# Patient Record
Sex: Female | Born: 1990 | Hispanic: Yes | Marital: Single
Health system: Southern US, Community
[De-identification: ages and names within clinical notes are randomized; demographics above are authoritative.]

---

## 2015-03-13 ENCOUNTER — Inpatient Hospital Stay
Admit: 2015-03-13 | Discharge: 2015-03-14 | Disposition: A | Discharge status: Home / Self Care | Payer: Medicaid HMO | Attending: Emergency Medicine

## 2015-03-13 DIAGNOSIS — B349 Viral infection, unspecified: Secondary | ICD-10-CM

## 2015-03-13 NOTE — ED Notes (Signed)
Discharge paperwork explained and provided to patient.  Patient verbalized understanding and will follow up with PCP.   Patient alert and orientated x3, vss, nad noted.  Patient ambulates out of ED with steady gait.

## 2015-03-13 NOTE — ED Triage Notes (Signed)
Pt chills, nausea and vomiting. Pt also c/o chest tightness, hx of asthma.

## 2015-03-13 NOTE — ED Provider Notes (Addendum)
The history is provided by the patient.     8:07 PM: Sylvia Baker is a 24 y.o. female with h/o asthma who presents to the ED c/o fevers, chills N/V, and weakness for the past couple of days. Pt also c/o loss of appetite, sore throat and rhinorrhea. She feels general malaise. Pt states she babysat her nephew who was sick and developed the same sx. Pt did not take her temperature but felt warm. No other complaints at this time.        Past Medical History:   Diagnosis Date   ??? Gastrointestinal disorder    ??? Psychiatric disorder        History reviewed. No pertinent past surgical history.      History reviewed. No pertinent family history.    Social History     Social History   ??? Marital status: SINGLE     Spouse name: N/A   ??? Number of children: N/A   ??? Years of education: N/A     Occupational History   ??? Not on file.     Social History Main Topics   ??? Smoking status: Never Smoker   ??? Smokeless tobacco: Not on file   ??? Alcohol use No   ??? Drug use: Not on file   ??? Sexual activity: Not on file     Other Topics Concern   ??? Not on file     Social History Narrative   ??? No narrative on file         ALLERGIES: Review of patient's allergies indicates no known allergies.    Review of Systems   Constitutional: Positive for appetite change (decreased), chills, fatigue and fever.   HENT: Positive for rhinorrhea and sore throat.    Eyes: Negative for photophobia and visual disturbance.   Respiratory: Negative for cough and shortness of breath.    Cardiovascular: Negative for chest pain and leg swelling.   Gastrointestinal: Positive for nausea and vomiting. Negative for abdominal pain and diarrhea.   Genitourinary: Negative for dysuria and hematuria.   Musculoskeletal: Negative for back pain and myalgias.   Skin: Negative for color change and pallor.   Neurological: Negative for seizures and syncope.       Vitals:    03/13/15 1852   BP: 112/74   Pulse: (!) 104   Resp: 18   Temp: 98 ??F (36.7 ??C)   SpO2: 100%    Weight: 54.4 kg (120 lb)   Height:  (1.499 m)            Physical Exam   Constitutional: She is oriented to person, place, and time. She appears well-developed and well-nourished. No distress.   Fatigued.    HENT:   Head: Normocephalic and atraumatic.   Nose: Rhinorrhea present.   Eyes: EOM are normal. Pupils are equal, round, and reactive to light.   Neck: Neck supple. No JVD present.   Cardiovascular: Normal rate, regular rhythm and normal heart sounds.    Pulmonary/Chest: Effort normal and breath sounds normal. No respiratory distress. She has no wheezes. She has no rales.   Abdominal: Soft. Bowel sounds are normal. She exhibits no distension. There is no tenderness. There is no rebound and no guarding.   Musculoskeletal: Normal range of motion. She exhibits no edema or tenderness.   Neurological: She is alert and oriented to person, place, and time.   Skin: Skin is warm and dry.   Psychiatric: She has a normal mood and affect.  Nursing note and vitals reviewed.       MDM  Number of Diagnoses or Management Options     Amount and/or Complexity of Data Reviewed  Clinical lab tests: ordered and reviewed  Decide to obtain previous medical records or to obtain history from someone other than the patient: yes  Review and summarize past medical records: yes  Independent visualization of images, tracings, or specimens: yes      ED Course       Procedures    I, Lowry Bowlob Kreel, PA, reviewed the patient's past history, allergies and home medications as documented in the nursing chart.    Labs:  Recent Results (from the past 24 hour(s))   STREP AG SCREEN, GROUP A    Collection Time: 03/13/15  8:00 PM   Result Value Ref Range    Group A Strep Ag ID NEGATIVE       Method IMMUNOCHROMATOGRAPHIC MEMBRANE ASSAY     INFLUENZA A & B AG (RAPID TEST)    Collection Time: 03/13/15  8:00 PM   Result Value Ref Range    Influenza A Ag NEGATIVE       Influenza B Ag NEGATIVE       Method IMMUNOCHROMATOGRAPHIC MEMBRANE ASSAY       Comment Result: Negative for Influenza A and B     CBC WITH AUTOMATED DIFF    Collection Time: 03/13/15  8:20 PM   Result Value Ref Range    WBC 11.0 (H) 4.8 - 10.6 K/uL    RBC 3.81 (L) 4.20 - 5.40 M/uL    HGB 11.9 (L) 12.0 - 16.0 g/dL    HCT 96.034.8 (L) 45.436.0 - 47.0 %    MCV 91.3 81.0 - 94.0 FL    MCH 31.2 27.0 - 35.0 PG    MCHC 34.2 30.7 - 37.3 g/dL    RDW 09.812.4 11.911.5 - 14.714.0 %    PLATELET 348 130 - 400 K/uL    MPV 11.9 (H) 9.2 - 11.8 FL    NEUTROPHILS 78 (H) 48.0 - 72.0 %    LYMPHOCYTES 16 (L) 18.0 - 40.0 %    MONOCYTES 5 2.0 - 12.0 %    EOSINOPHILS 1 0.0 - 7.0 %    BASOPHILS 0 0.0 - 3.0 %    ABS. NEUTROPHILS 8.7 (H) 1.5 - 6.6 K/UL    ABS. LYMPHOCYTES 1.7 1.5 - 3.5 K/UL    ABS. MONOCYTES 0.5 0.0 - 1.0 K/UL    ABS. EOSINOPHILS 0.1 0.0 - 0.7 K/UL    ABS. BASOPHILS 0.0 0.0 - 0.1 K/UL    DF AUTOMATED      IMMATURE GRANULOCYTES 0.2 0.0 - 2.0 %   METABOLIC PANEL, BASIC    Collection Time: 03/13/15  8:20 PM   Result Value Ref Range    Sodium 139 136 - 145 mmol/L    Potassium 3.8 3.5 - 5.1 mmol/L    Chloride 107 98 - 107 mmol/L    CO2 21 21 - 32 mmol/L    Anion gap 14 10 - 20 mmol/L    Glucose 81 74 - 106 mg/dL    BUN 11 7 - 18 mg/dL    Creatinine 8.290.80 5.620.40 - 1.16 mg/dL    GFR est AA >13>60 >08>60 MV/HQI/6.96E9ml/min/1.73m2    GFR est non-AA >60 >60 ml/min/1.4873m2    Calcium 8.8 8.5 - 10.1 mg/dL   MAGNESIUM    Collection Time: 03/13/15  8:20 PM   Result Value Ref Range  Magnesium 2.1 1.6 - 2.6 mg/dL       Radiology:           <EMERGENCY DEPARTMENT CASE SUMMARY>    Impression/Differential Diagnosis: fever cough    ED Course:     Pt is 24 yr old female who presents to the ED c/o fevers, N/V and chills.  Labs ordered. Zofran and duo-neb given.   2130  Pt feeling better  Labs are unremarkable      Final Impression/Diagnosis:  Viral syndrome    Patient condition at time of disposition: stable      I have reviewed the following home medications:    Prior to Admission medications    Medication Sig Start Date End Date Taking? Authorizing Provider    omeprazole (PRILOSEC) 20 mg capsule Take 20 mg by mouth daily.   Yes Phys Other, MD   buPROPion XL (WELLBUTRIN XL) 300 mg XL tablet Take 300 mg by mouth every morning.   Yes Phys Other, MD   fluticasone-salmeterol (ADVAIR DISKUS) 100-50 mcg/dose diskus inhaler Take 1 Puff by inhalation two (2) times a day.   Yes Phys Other, MD   norgestimate-ethinyl estradiol (SPRINTEC, 28,) 0.25-35 mg-mcg tab Take 1 Tab by mouth daily.   Yes Phys Other, MD       Lowry Bowl, PA     I, Artis Delay, am serving as a scribe to document services personally performed by Lowry Bowl, PA, based on my observation and the provider's statements to me.    I, Lowry Bowl, PA, attest that the person(s) noted above, acting as my scribe(s) noted above, has observed my performance of the services and has documented them in accordance with my direction. I have personally reviewed the above information and have ordered and reviewed the diagnostic studies, unless otherwise noted.  I was personally available for consultation in the emergency department.  I have reviewed the chart and agree with the documentation recorded by the [facility], including the assessment, treatment plan, and disposition.  Earnie Larsson, MD

## 2015-03-14 LAB — METABOLIC PANEL, BASIC
Anion gap: 14 mmol/L (ref 10–20)
BUN: 11 mg/dL (ref 7–18)
CO2: 21 mmol/L (ref 21–32)
Calcium: 8.8 mg/dL (ref 8.5–10.1)
Chloride: 107 mmol/L (ref 98–107)
Creatinine: 0.8 mg/dL (ref 0.40–1.16)
GFR est AA: >60 mL/min/{1.73_m2} (ref >60)
GFR est non-AA: >60 mL/min/{1.73_m2} (ref >60)
Glucose: 81 mg/dL (ref 74–106)
Potassium: 3.8 mmol/L (ref 3.5–5.1)
Sodium: 139 mmol/L (ref 136–145)

## 2015-03-14 LAB — INFLUENZA A & B AG (RAPID TEST)
Comment: NEGATIVE
Influenza A Ag: NEGATIVE
Influenza B Ag: NEGATIVE

## 2015-03-14 LAB — CBC WITH AUTOMATED DIFF
ABS. BASOPHILS: 0 10*3/uL (ref 0.0–0.1)
ABS. EOSINOPHILS: 0.1 10*3/uL (ref 0.0–0.7)
ABS. LYMPHOCYTES: 1.7 10*3/uL (ref 1.5–3.5)
ABS. MONOCYTES: 0.5 10*3/uL (ref 0.0–1.0)
ABS. NEUTROPHILS: 8.7 10*3/uL — ABNORMAL HIGH (ref 1.5–6.6)
BASOPHILS: 0 % (ref 0.0–3.0)
EOSINOPHILS: 1 % (ref 0.0–7.0)
HCT: 34.8 % — ABNORMAL LOW (ref 36.0–47.0)
HGB: 11.9 g/dL — ABNORMAL LOW (ref 12.0–16.0)
IMMATURE GRANULOCYTES: 0.2 % (ref 0.0–2.0)
LYMPHOCYTES: 16 % — ABNORMAL LOW (ref 18.0–40.0)
MCH: 31.2 PG (ref 27.0–35.0)
MCHC: 34.2 g/dL (ref 30.7–37.3)
MCV: 91.3 FL (ref 81.0–94.0)
MONOCYTES: 5 % (ref 2.0–12.0)
MPV: 11.9 FL — ABNORMAL HIGH (ref 9.2–11.8)
NEUTROPHILS: 78 % — ABNORMAL HIGH (ref 48.0–72.0)
PLATELET: 348 10*3/uL (ref 130–400)
RBC: 3.81 M/uL — ABNORMAL LOW (ref 4.20–5.40)
RDW: 12.4 % (ref 11.5–14.0)
WBC: 11 10*3/uL — ABNORMAL HIGH (ref 4.8–10.6)

## 2015-03-14 LAB — MAGNESIUM: Magnesium: 2.1 mg/dL (ref 1.6–2.6)

## 2015-03-14 LAB — STREP AG SCREEN, GROUP A: Group A Strep Ag ID: NEGATIVE

## 2015-03-14 MED ORDER — SODIUM CHLORIDE 0.9% BOLUS IV
0.9 % | Freq: Once | INTRAVENOUS | Status: AC
Start: 2015-03-14 — End: 2015-03-13
  Administered 2015-03-14: 01:00:00 via INTRAVENOUS

## 2015-03-14 MED ORDER — IPRATROPIUM-ALBUTEROL 2.5 MG-0.5 MG/3 ML NEB SOLUTION
2.5 mg-0.5 mg/3 ml | RESPIRATORY_TRACT | Status: AC
Start: 2015-03-14 — End: 2015-03-13
  Administered 2015-03-14: 01:00:00 via RESPIRATORY_TRACT

## 2015-03-14 MED ORDER — ONDANSETRON (PF) 4 MG/2 ML INJECTION
4 mg/2 mL | INTRAMUSCULAR | Status: AC
Start: 2015-03-14 — End: 2015-03-13
  Administered 2015-03-14: 01:00:00 via INTRAVENOUS

## 2015-03-14 MED FILL — ONDANSETRON (PF) 4 MG/2 ML INJECTION: 4 mg/2 mL | INTRAMUSCULAR | Qty: 2

## 2015-03-14 MED FILL — SODIUM CHLORIDE 0.9 % IV: INTRAVENOUS | Qty: 2000

## 2015-03-14 MED FILL — IPRATROPIUM-ALBUTEROL 2.5 MG-0.5 MG/3 ML NEB SOLUTION: 2.5 mg-0.5 mg/3 ml | RESPIRATORY_TRACT | Qty: 3

## 2015-03-16 LAB — THROAT CULTURE

## 2020-06-21 ENCOUNTER — Observation Stay (HOSPITAL_COMMUNITY)
Admission: EM | Admit: 2020-06-21 | Discharge: 2020-06-23 | Disposition: A | Discharge status: Home / Self Care | Payer: Medicaid - Out of State | Attending: General Surgery | Admitting: General Surgery

## 2020-06-21 ENCOUNTER — Emergency Department (HOSPITAL_COMMUNITY): Payer: Medicaid - Out of State

## 2020-06-21 ENCOUNTER — Other Ambulatory Visit: Payer: Self-pay

## 2020-06-21 ENCOUNTER — Encounter (HOSPITAL_COMMUNITY): Payer: Self-pay | Admitting: Physician Assistant

## 2020-06-21 DIAGNOSIS — K8012 Calculus of gallbladder with acute and chronic cholecystitis without obstruction: Principal | ICD-10-CM | POA: Insufficient documentation

## 2020-06-21 DIAGNOSIS — J45909 Unspecified asthma, uncomplicated: Secondary | ICD-10-CM | POA: Diagnosis not present

## 2020-06-21 DIAGNOSIS — R10811 Right upper quadrant abdominal tenderness: Secondary | ICD-10-CM

## 2020-06-21 DIAGNOSIS — Z20822 Contact with and (suspected) exposure to covid-19: Secondary | ICD-10-CM | POA: Insufficient documentation

## 2020-06-21 DIAGNOSIS — K810 Acute cholecystitis: Secondary | ICD-10-CM | POA: Diagnosis present

## 2020-06-21 DIAGNOSIS — R1011 Right upper quadrant pain: Secondary | ICD-10-CM | POA: Diagnosis present

## 2020-06-21 DIAGNOSIS — K819 Cholecystitis, unspecified: Secondary | ICD-10-CM

## 2020-06-21 LAB — URINALYSIS, ROUTINE W REFLEX MICROSCOPIC
Bilirubin Urine: NEGATIVE
Glucose, UA: NEGATIVE mg/dL
Ketones, ur: 80 mg/dL — AB
Leukocytes,Ua: NEGATIVE
Nitrite: NEGATIVE
Protein, ur: NEGATIVE mg/dL
Specific Gravity, Urine: 1.024 (ref 1.005–1.030)
pH: 5 (ref 5.0–8.0)

## 2020-06-21 LAB — COMPREHENSIVE METABOLIC PANEL
ALT: 16 U/L (ref 0–44)
AST: 21 U/L (ref 15–41)
Albumin: 3.3 g/dL — ABNORMAL LOW (ref 3.5–5.0)
Alkaline Phosphatase: 65 U/L (ref 38–126)
Anion gap: 7 (ref 5–15)
BUN: 8 mg/dL (ref 6–20)
CO2: 26 mmol/L (ref 22–32)
Calcium: 9 mg/dL (ref 8.9–10.3)
Chloride: 103 mmol/L (ref 98–111)
Creatinine, Ser: 0.86 mg/dL (ref 0.44–1.00)
GFR, Estimated: >60 mL/min (ref >60)
Glucose, Bld: 95 mg/dL (ref 70–99)
Potassium: 4.2 mmol/L (ref 3.5–5.1)
Sodium: 136 mmol/L (ref 135–145)
Total Bilirubin: 0.2 mg/dL — ABNORMAL LOW (ref 0.3–1.2)
Total Protein: 6.9 g/dL (ref 6.5–8.1)

## 2020-06-21 LAB — CBC WITH DIFFERENTIAL/PLATELET
Abs Immature Granulocytes: 0.03 10*3/uL (ref 0.00–0.07)
Basophils Absolute: 0.1 10*3/uL (ref 0.0–0.1)
Basophils Relative: 1 %
Eosinophils Absolute: 0.1 10*3/uL (ref 0.0–0.5)
Eosinophils Relative: 1 %
HCT: 38.5 % (ref 36.0–46.0)
Hemoglobin: 12.5 g/dL (ref 12.0–15.0)
Immature Granulocytes: 0 %
Lymphocytes Relative: 18 %
Lymphs Abs: 2.1 10*3/uL (ref 0.7–4.0)
MCH: 30.8 pg (ref 26.0–34.0)
MCHC: 32.5 g/dL (ref 30.0–36.0)
MCV: 94.8 fL (ref 80.0–100.0)
Monocytes Absolute: 0.6 10*3/uL (ref 0.1–1.0)
Monocytes Relative: 5 %
Neutro Abs: 8.5 10*3/uL — ABNORMAL HIGH (ref 1.7–7.7)
Neutrophils Relative %: 75 %
Platelets: 373 10*3/uL (ref 150–400)
RBC: 4.06 MIL/uL (ref 3.87–5.11)
RDW: 12.6 % (ref 11.5–15.5)
WBC: 11.4 10*3/uL — ABNORMAL HIGH (ref 4.0–10.5)
nRBC: 0 % (ref 0.0–0.2)

## 2020-06-21 LAB — LIPASE, BLOOD: Lipase: 25 U/L (ref 11–51)

## 2020-06-21 LAB — I-STAT BETA HCG BLOOD, ED (MC, WL, AP ONLY): I-stat hCG, quantitative: <5 m[IU]/mL (ref <5)

## 2020-06-21 MED ORDER — SODIUM CHLORIDE 0.9 % IV BOLUS
1000.0000 mL | Freq: Once | INTRAVENOUS | Status: AC
  Administered 2020-06-21: 1000 mL via INTRAVENOUS

## 2020-06-21 MED ORDER — MORPHINE SULFATE (PF) 4 MG/ML IV SOLN: 4.0000 mg | Freq: Once | INTRAVENOUS | Status: DC

## 2020-06-21 MED ORDER — ONDANSETRON HCL 4 MG/2ML IJ SOLN
4.0000 mg | Freq: Once | INTRAMUSCULAR | Status: AC
  Administered 2020-06-21: 4 mg via INTRAVENOUS
  Filled 2020-06-21: qty 2

## 2020-06-21 MED ORDER — IOHEXOL 300 MG/ML  SOLN
100.0000 mL | Freq: Once | INTRAMUSCULAR | Status: AC | PRN
  Administered 2020-06-21: 100 mL via INTRAVENOUS

## 2020-06-21 MED ORDER — MORPHINE SULFATE (PF) 4 MG/ML IV SOLN
4.0000 mg | Freq: Once | INTRAVENOUS | Status: AC
  Administered 2020-06-21: 4 mg via INTRAVENOUS
  Filled 2020-06-21: qty 1

## 2020-06-21 NOTE — Discharge Instructions (Addendum)
[facility] LAPAROSCOPIC SURGERY: POST OP INSTRUCTIONS Always review your discharge instruction sheet given to you by the facility where your surgery was performed. IF YOU HAVE DISABILITY OR FAMILY LEAVE FORMS, YOU MUST BRING THEM TO THE OFFICE FOR PROCESSING.   DO NOT GIVE THEM TO YOUR DOCTOR.  PAIN CONTROL  1. First take acetaminophen (Tylenol) AND/or ibuprofen (Advil) to control your pain after surgery.  Follow directions on package.  Taking acetaminophen (Tylenol) and/or ibuprofen (Advil) regularly after surgery will help to control your pain and lower the amount of prescription pain medication you may need.  You should not take more than 3,000 mg (3 grams) of acetaminophen (Tylenol) in 24 hours.  You should not take ibuprofen (Advil), aleve, motrin, naprosyn or other NSAIDS if you have a history of stomach ulcers or chronic kidney disease.  2. A prescription for pain medication may be given to you upon discharge.  Take your pain medication as prescribed, if you still have uncontrolled pain after taking acetaminophen (Tylenol) or ibuprofen (Advil). 3. Use ice packs to help control pain. 4. If you need a refill on your pain medication, please contact your pharmacy.  They will contact our office to request authorization. Prescriptions will not be filled after 5pm or on week-ends.  HOME MEDICATIONS 5. Take your usually prescribed medications unless otherwise directed.  DIET 6. You should follow a light diet the first few days after arrival home.  Be sure to include lots of fluids daily. Avoid fatty, fried foods.   CONSTIPATION 7. It is common to experience some constipation after surgery and if you are taking pain medication.  Increasing fluid intake and taking a stool softener (such as Colace) will usually help or prevent this problem from occurring.  A mild laxative (Milk of Magnesia or Miralax) should be taken according to package instructions if there are no bowel  movements after 48 hours.  WOUND/INCISION CARE 8. Most patients will experience some swelling and bruising in the area of the incisions.  Ice packs will help.  Swelling and bruising can take several days to resolve.  9. Unless discharge instructions indicate otherwise, follow guidelines below  a. STERI-STRIPS - you may remove your outer bandages 48 hours after surgery, and you may shower at that time.  You have steri-strips (small skin tapes) in place directly over the incision.  These strips should be left on the skin for 7-10 days.   b. DERMABOND/SKIN GLUE - you may shower in 24 hours.  The glue will flake off over the next 2-3 weeks. 10. Any sutures or staples will be removed at the office during your follow-up visit.  ACTIVITIES 11. You may resume regular (light) daily activities beginning the next day--such as daily self-care, walking, climbing stairs--gradually increasing activities as tolerated.  You may have sexual intercourse when it is comfortable.  Refrain from any heavy lifting or straining until approved by your doctor. a. You may drive when you are no longer taking prescription pain medication, you can comfortably wear a seatbelt, and you can safely maneuver your car and apply brakes.  FOLLOW-UP 12. You should see your doctor in the office for a follow-up appointment approximately 2-3 weeks after your surgery.  You should have been given your post-op/follow-up appointment when your surgery was scheduled.  If you did not receive a post-op/follow-up appointment, make sure that you call for this appointment within a day or two after you arrive home to insure a convenient appointment time.     WHEN TO CALL YOUR DOCTOR: 1. Fever over 101.0 2. Inability to urinate 3. Continued bleeding from incision. 4. Increased pain, redness, or drainage from the incision. 5. Increasing abdominal pain  The clinic staff is available to answer your questions during regular business hours.  Please don't  hesitate to call and ask to speak to one of the nurses for clinical concerns.  If you have a medical emergency, go to the nearest emergency room or call 911.  A surgeon from [facility] is always on call at the hospital. [address], [address], [place], [place]  [address] ? [address] [address], [place], [place]   [address] [phone] ? [phone] ? FAX [phone] Web site: [email or web address]  .........   Managing Your Pain After Surgery Without Opioids    Thank you for participating in our program to help patients manage their pain after surgery without opioids. This is part of our effort to provide you with the best care possible, without exposing you or your family to the risk that opioids pose.  What pain can I expect after surgery? You can expect to have some pain after surgery. This is normal. The pain is typically worse the day after surgery, and quickly begins to get better. Many studies have found that many patients are able to manage their pain after surgery with Over-the-Counter (OTC) medications such as Tylenol and Motrin. If you have a condition that does not allow you to take Tylenol or Motrin, notify your surgical team.  How will I manage my pain? The best strategy for controlling your pain after surgery is around the clock pain control with Tylenol (acetaminophen) and Motrin (ibuprofen or Advil). Alternating these medications with each other allows you to maximize your pain control. In addition to Tylenol and Motrin, you can use heating pads or ice packs on your incisions to help reduce your pain.  How will I alternate your regular strength over-the-counter pain medication? You will take a dose of pain medication every three hours. ; Start by taking 650 mg of Tylenol (2 pills of 325 mg) ; 3 hours later take 600 mg of Motrin (3 pills of 200 mg) ; 3 hours after taking the Motrin take 650 mg of Tylenol ; 3 hours after that take 600 mg of  Motrin.   - 1 -  See example - if your first dose of Tylenol is at 12:00 PM   12:00 PM Tylenol 650 mg (2 pills of 325 mg)  3:00 PM Motrin 600 mg (3 pills of 200 mg)  6:00 PM Tylenol 650 mg (2 pills of 325 mg)  9:00 PM Motrin 600 mg (3 pills of 200 mg)  Continue alternating every 3 hours   We recommend that you follow this schedule around-the-clock for at least 3 days after surgery, or until you feel that it is no longer needed. Use the table on the last page of this handout to keep track of the medications you are taking. Important: Do not take more than 3000mg of Tylenol or 3200mg of Motrin in a 24-hour period. Do not take ibuprofen/Motrin if you have a history of bleeding stomach ulcers, severe kidney disease, &/or actively taking a blood thinner  What if I still have pain? If you have pain that is not controlled with the over-the-counter pain medications (Tylenol and Motrin or Advil) you might have what we call "breakthrough" pain. You will receive a prescription for a small amount of an opioid pain medication such as   Oxycodone, Tramadol, or Tylenol with Codeine. Use these opioid pills in the first 24 hours after surgery if you have breakthrough pain. Do not take more than 1 pill every 4-6 hours.  If you still have uncontrolled pain after using all opioid pills, don't hesitate to call our staff using the number provided. We will help make sure you are managing your pain in the best way possible, and if necessary, we can provide a prescription for additional pain medication.   Day 1    Time  Name of Medication Number of pills taken  Amount of Acetaminophen  Pain Level   Comments  AM PM       AM PM       AM PM       AM PM       AM PM       AM PM       AM PM       AM PM       Total Daily amount of Acetaminophen Do not take more than  3,000 mg per day      Day 2    Time  Name of Medication Number of pills taken  Amount of Acetaminophen  Pain Level   Comments  AM  PM       AM PM       AM PM       AM PM       AM PM       AM PM       AM PM       AM PM       Total Daily amount of Acetaminophen Do not take more than  3,000 mg per day      Day 3    Time  Name of Medication Number of pills taken  Amount of Acetaminophen  Pain Level   Comments  AM PM       AM PM       AM PM       AM PM          AM PM       AM PM       AM PM       AM PM       Total Daily amount of Acetaminophen Do not take more than  3,000 mg per day      Day 4    Time  Name of Medication Number of pills taken  Amount of Acetaminophen  Pain Level   Comments  AM PM       AM PM       AM PM       AM PM       AM PM       AM PM       AM PM       AM PM       Total Daily amount of Acetaminophen Do not take more than  3,000 mg per day      Day 5    Time  Name of Medication Number of pills taken  Amount of Acetaminophen  Pain Level   Comments  AM PM       AM PM       AM PM       AM PM       AM PM       AM PM       AM PM         AM PM       Total Daily amount of Acetaminophen Do not take more than  3,000 mg per day       Day 6    Time  Name of Medication Number of pills taken  Amount of Acetaminophen  Pain Level  Comments  AM PM       AM PM       AM PM       AM PM       AM PM       AM PM       AM PM       AM PM       Total Daily amount of Acetaminophen Do not take more than  3,000 mg per day      Day 7    Time  Name of Medication Number of pills taken  Amount of Acetaminophen  Pain Level   Comments  AM PM       AM PM       AM PM       AM PM       AM PM       AM PM       AM PM       AM PM       Total Daily amount of Acetaminophen Do not take more than  3,000 mg per day        For additional information about how and where to safely dispose of unused opioid medications - https://www.[employer]powerful[employer].org  Disclaimer: This document contains information and/or instructional materials adapted from [facility]  for the typical patient with your condition. It does not replace medical advice from your health care provider because your experience may differ from that of the typical patient. Talk to your health care provider if you have any questions about this document, your condition or your treatment plan. Adapted from [facility]  

## 2020-06-21 NOTE — ED Provider Notes (Addendum)
[facility] EMERGENCY DEPARTMENT Provider Note   CSN: [id number] Arrival date & time: 06/21/20  1830     History Chief Complaint  Patient presents with  . Abdominal Pain    Rebecca Lester is a 30 y.o. female.  HPI   Pt is a 30 y/o female who presents to the eD today for eval of right sided abd pain that started 4 days ago. Pain has been constant and seems to wax and wane. Pain feels like a twisting/sqeezing/sharp pain. Rates pain 7-8/10. Reports associated nausea, vomiting and diarrhea. Denies dysuria, frequency, hematuria. Denies abnormal vaginal discharge or bleeding. Denies fevers. Has tried taken miralax, tylenol, ginger tea, and pepto bismal without relief.   History reviewed. No pertinent past medical history.  There are no problems to display for this patient.   History reviewed. No pertinent surgical history.   OB History   No obstetric history on file.     History reviewed. No pertinent family history.     Home Medications Prior to Admission medications   Not on File    Allergies    Patient has no known allergies.  Review of Systems   Review of Systems  Constitutional: Negative for chills and fever.  HENT: Negative for ear pain and sore throat.   Eyes: Negative for pain and visual disturbance.  Respiratory: Negative for cough and shortness of breath.   Cardiovascular: Negative for chest pain.  Gastrointestinal: Positive for abdominal pain, diarrhea, nausea and vomiting.  Genitourinary: Negative for dysuria, flank pain, hematuria and urgency.  Musculoskeletal: Negative for back pain.  Skin: Negative for rash.  Neurological: Negative for headaches.  All other systems reviewed and are negative.   Physical Exam Updated Vital Signs BP (!) 136/94   Pulse 60   Temp 98.2 F (36.8 C) (Oral)   Resp 18   Ht 4' 11.5" (1.511 m)   Wt 65.8 kg   SpO2 100%   BMI 28.80 kg/m   Physical Exam Vitals and nursing note reviewed.   Constitutional:      General: She is not in acute distress.    Appearance: She is well-developed.  HENT:     Head: Normocephalic and atraumatic.  Eyes:     Conjunctiva/sclera: Conjunctivae normal.  Cardiovascular:     Rate and Rhythm: Normal rate and regular rhythm.     Heart sounds: No murmur heard.   Pulmonary:     Effort: Pulmonary effort is normal. No respiratory distress.     Breath sounds: Normal breath sounds.  Abdominal:     General: Bowel sounds are normal.     Palpations: Abdomen is soft.     Tenderness: There is abdominal tenderness in the right upper quadrant. There is guarding. There is no rebound.  Musculoskeletal:     Cervical back: Neck supple.  Skin:    General: Skin is warm and dry.  Neurological:     Mental Status: She is alert.     ED Results / Procedures / Treatments   Labs (all labs ordered are listed, but only abnormal results are displayed) Labs Reviewed  CBC WITH DIFFERENTIAL/PLATELET - Abnormal; Notable for the following components:      Result Value   WBC 11.4 (*)    Neutro Abs 8.5 (*)    All other components within normal limits  COMPREHENSIVE METABOLIC PANEL - Abnormal; Notable for the following components:   Albumin 3.3 (*)    Total Bilirubin 0.2 (*)    All other components  within normal limits  URINALYSIS, ROUTINE W REFLEX MICROSCOPIC - Abnormal; Notable for the following components:   Hgb urine dipstick SMALL (*)    Ketones, ur 80 (*)    Bacteria, UA RARE (*)    All other components within normal limits  RESP PANEL BY RT-PCR (FLU A&B, COVID) ARPGX2  LIPASE, BLOOD  I-STAT BETA HCG BLOOD, ED ([facility], [facility], [facility] ONLY)    EKG None  Radiology CT ABDOMEN PELVIS W CONTRAST  Result Date: 06/21/2020 CLINICAL DATA:  Right lower quadrant abdominal pain. EXAM: CT ABDOMEN AND PELVIS WITH CONTRAST TECHNIQUE: Multidetector CT imaging of the abdomen and pelvis was performed using the standard protocol following bolus administration of intravenous  contrast. CONTRAST:  OMNIPAQUE IOHEXOL 300 MG/ML  SOLN COMPARISON:  None. FINDINGS: Lower chest: No acute abnormality. Hepatobiliary: No focal liver abnormality. Nonspecific hydropic gallbladder measuring up to 4 cm on axial imaging. No gallstones, gallbladder wall thickening, or pericholecystic fluid. No biliary dilatation. Pancreas: No focal lesion. Normal pancreatic contour. No surrounding inflammatory changes. No main pancreatic ductal dilatation. Spleen: Normal in size without focal abnormality. Adrenals/Urinary Tract: No adrenal nodule bilaterally. Bilateral kidneys enhance symmetrically. No hydronephrosis. No hydroureter. The urinary bladder is decompressed and grossly unremarkable. Stomach/Bowel: Stomach is within normal limits. No evidence of bowel wall thickening or dilatation. Appendix appears normal. Vascular/Lymphatic: No significant vascular findings are present. No enlarged abdominal or pelvic lymph nodes. Reproductive: Uterus and bilateral adnexa are unremarkable. Other: No intraperitoneal free fluid. No intraperitoneal free gas. No organized fluid collection. Musculoskeletal: No acute or significant osseous findings. IMPRESSION: 1. Nonspecific hydropic gallbladder with otherwise no acute intra-abdominal or intrapelvic abnormality. If clinically indicated, consider right upper quadrant ultrasound for a more sensitive evaluation of the gallbladder. 2. Normal appendix. Electronically Signed   By: Tish Frederickson M.D.   On: 06/21/2020 22:29   US Abdomen Limited RUQ (LIVER/GB)  Result Date: 06/21/2020 CLINICAL DATA:  Right upper quadrant abdominal pain EXAM: ULTRASOUND ABDOMEN LIMITED RIGHT UPPER QUADRANT COMPARISON:  CT from same day FINDINGS: Gallbladder: There is cholelithiasis with mild borderline gallbladder wall thickening. The gallbladder is distended. The sonographic Eulah Pont sign is positive. There is no pericholecystic free fluid. Common bile duct: Diameter: 3 mm Liver: No focal lesion  identified. Within normal limits in parenchymal echogenicity. Portal vein is patent on color Doppler imaging with normal direction of blood flow towards the liver. Other: None. IMPRESSION: Cholelithiasis with findings suspicious for acute cholecystitis. Surgical consultation is recommended. If imaging findings are discordant with laboratory and physical exam findings, a HIDA scan would be useful for further evaluation. Electronically Signed   By: Katherine Mantle M.D.   On: 06/21/2020 23:50    Procedures Procedures   Medications Ordered in ED Medications  morphine 4 MG/ML injection 4 mg (0 mg Intravenous Hold 06/21/20 2336)  cefTRIAXone (ROCEPHIN) 2 g in sodium chloride 0.9 % 100 mL IVPB (has no administration in time range)  iohexol (OMNIPAQUE) 300 MG/ML solution 100 mL (100 mLs Intravenous Contrast Given 06/21/20 2213)  ondansetron (ZOFRAN) injection 4 mg (4 mg Intravenous Given 06/21/20 2252)  sodium chloride 0.9 % bolus 1,000 mL (1,000 mLs Intravenous New Bag/Given 06/21/20 2252)  morphine 4 MG/ML injection 4 mg (4 mg Intravenous Given 06/21/20 2252)    ED Course  I have reviewed the triage vital signs and the nursing notes.  Pertinent labs & imaging results that were available during my care of the patient were reviewed by me and considered in my medical decision making (see  chart for details).    MDM Rules/Calculators/A&P                          30 y/o F presents for eval of abd pain, nv  Reviewed/interpreted labs CBC with mild leukocytosis, no anemia CMP unremarkable Lipase negative UA with hematuria, ketonuria, rare bacteria Beta hcg neg  CT abd/pelvis -  1. Nonspecific hydropic gallbladder with otherwise no acute intra-abdominal or intrapelvic abnormality. If clinically indicated, consider right upper quadrant ultrasound for a more sensitive evaluation of the gallbladder. 2. Normal appendix.   RUQ [place] - Cholelithiasis with findings suspicious for acute cholecystitis. Surgical  consultation is recommended. If imaging findings are discordant with laboratory and physical exam findings, a HIDA scan would be useful for further evaluation.  12:00 AM CONSULT with Dr. Derrell Lolling who accepts patient for admission   Final Clinical Impression(s) / ED Diagnoses Final diagnoses:  RUQ abdominal tenderness  Cholecystitis    Rx / DC Orders ED Discharge Orders    None       Karrie Meres, PA-C 06/21/20 2344    Sarajane Fambrough S, PA-C 06/22/20 0001    Alvira Monday, MD 06/22/20 1211

## 2020-06-21 NOTE — ED Triage Notes (Addendum)
Pt here pov with reports of generalized abd pain with nausea and vomiting. Pain onset 4 days ago. Appears to be more tender on the R. Denies urinary symptoms. Pt states mom, aunt and first cousin all have colon CA.

## 2020-06-21 NOTE — ED Notes (Signed)
Patient transported to Ultrasound 

## 2020-06-21 NOTE — ED Notes (Signed)
Patient ambulatory to restroom independently with steady gait.

## 2020-06-21 NOTE — ED Triage Notes (Addendum)
Emergency Medicine Provider Triage Evaluation Note  Rebecca Lester , a 30 y.o. female  was evaluated in triage.  Pt complains of generalized abdominal pain x4 days associated with nausea and vomiting. No previous abdominal operations. No urinary or vaginal symptoms. Strong family history for colon cancer.  Review of Systems  Positive: Abdominal pain, nausea, vomiting Negative: Urinary symptoms  Physical Exam  BP (!) 134/91 (BP Location: Left Arm)   Pulse 79   Temp 98.2 F (36.8 C) (Oral)   Resp (!) 24   SpO2 100%  Gen:   Awake, no distress   HEENT:  Atraumatic  Resp:  Normal effort Cardiac:  Normal rate  Abd:   Nondistended, diffuse abdominal tenderness with guarding MSK:   Moves extremities without difficulty  Neuro:  Speech clear   Medical Decision Making  Medically screening exam initiated at 6:58 PM.  Appropriate orders placed.  Rebecca Lester was informed that the remainder of the evaluation will be completed by another provider, this initial triage assessment does not replace that evaluation, and the importance of remaining in the ED until their evaluation is complete.  Clinical Impression  Significant diffuse abdominal tenderness with guarding. Routine labs ordered. CT abdomen ordered.   Mannie Stabile, PA-C 06/21/20 1900    Mannie Stabile, PA-C 06/21/20 1902

## 2020-06-21 NOTE — ED Notes (Signed)
Pt in CT.

## 2020-06-22 ENCOUNTER — Observation Stay (HOSPITAL_COMMUNITY): Payer: Medicaid - Out of State | Admitting: Anesthesiology

## 2020-06-22 ENCOUNTER — Encounter (HOSPITAL_COMMUNITY)
Admission: EM | Disposition: A | Discharge status: Home / Self Care | Payer: Self-pay | Source: Home / Self Care | Attending: Emergency Medicine

## 2020-06-22 ENCOUNTER — Encounter (HOSPITAL_COMMUNITY): Payer: Self-pay

## 2020-06-22 DIAGNOSIS — J45909 Unspecified asthma, uncomplicated: Secondary | ICD-10-CM | POA: Diagnosis not present

## 2020-06-22 DIAGNOSIS — K810 Acute cholecystitis: Secondary | ICD-10-CM | POA: Diagnosis present

## 2020-06-22 DIAGNOSIS — K8012 Calculus of gallbladder with acute and chronic cholecystitis without obstruction: Secondary | ICD-10-CM | POA: Diagnosis not present

## 2020-06-22 DIAGNOSIS — Z20822 Contact with and (suspected) exposure to covid-19: Secondary | ICD-10-CM | POA: Diagnosis not present

## 2020-06-22 HISTORY — PX: CHOLECYSTECTOMY: SHX55

## 2020-06-22 LAB — CBC
HCT: 35.7 % — ABNORMAL LOW (ref 36.0–46.0)
Hemoglobin: 11.9 g/dL — ABNORMAL LOW (ref 12.0–15.0)
MCH: 31.6 pg (ref 26.0–34.0)
MCHC: 33.3 g/dL (ref 30.0–36.0)
MCV: 94.7 fL (ref 80.0–100.0)
Platelets: 332 10*3/uL (ref 150–400)
RBC: 3.77 MIL/uL — ABNORMAL LOW (ref 3.87–5.11)
RDW: 12.5 % (ref 11.5–15.5)
WBC: 13.2 10*3/uL — ABNORMAL HIGH (ref 4.0–10.5)
nRBC: 0 % (ref 0.0–0.2)

## 2020-06-22 LAB — COMPREHENSIVE METABOLIC PANEL
ALT: 17 U/L (ref 0–44)
AST: 19 U/L (ref 15–41)
Albumin: 2.9 g/dL — ABNORMAL LOW (ref 3.5–5.0)
Alkaline Phosphatase: 63 U/L (ref 38–126)
Anion gap: 7 (ref 5–15)
BUN: 5 mg/dL — ABNORMAL LOW (ref 6–20)
CO2: 23 mmol/L (ref 22–32)
Calcium: 8.2 mg/dL — ABNORMAL LOW (ref 8.9–10.3)
Chloride: 105 mmol/L (ref 98–111)
Creatinine, Ser: 0.69 mg/dL (ref 0.44–1.00)
GFR, Estimated: >60 mL/min (ref >60)
Glucose, Bld: 132 mg/dL — ABNORMAL HIGH (ref 70–99)
Potassium: 3.9 mmol/L (ref 3.5–5.1)
Sodium: 135 mmol/L (ref 135–145)
Total Bilirubin: 0.4 mg/dL (ref 0.3–1.2)
Total Protein: 6.1 g/dL — ABNORMAL LOW (ref 6.5–8.1)

## 2020-06-22 LAB — SURGICAL PCR SCREEN
MRSA, PCR: NEGATIVE
Staphylococcus aureus: NEGATIVE

## 2020-06-22 LAB — RESP PANEL BY RT-PCR (FLU A&B, COVID) ARPGX2
Influenza A by PCR: NEGATIVE
Influenza B by PCR: NEGATIVE
SARS Coronavirus 2 by RT PCR: NEGATIVE

## 2020-06-22 SURGERY — LAPAROSCOPIC CHOLECYSTECTOMY
Anesthesia: General | Site: Abdomen

## 2020-06-22 MED ORDER — FENTANYL CITRATE (PF) 100 MCG/2ML IJ SOLN
INTRAMUSCULAR | Status: DC | PRN
  Administered 2020-06-22: 50 ug via INTRAVENOUS
  Administered 2020-06-22: 25 ug via INTRAVENOUS
  Administered 2020-06-22: 50 ug via INTRAVENOUS
  Administered 2020-06-22: 25 ug via INTRAVENOUS
  Administered 2020-06-22: 50 ug via INTRAVENOUS
  Administered 2020-06-22 (×2): 25 ug via INTRAVENOUS

## 2020-06-22 MED ORDER — SODIUM CHLORIDE 0.9 % IR SOLN
Status: DC | PRN
  Administered 2020-06-22: 1000 mL

## 2020-06-22 MED ORDER — LIDOCAINE 2% (20 MG/ML) 5 ML SYRINGE
INTRAMUSCULAR | Status: DC | PRN
  Administered 2020-06-22: 100 mg via INTRAVENOUS

## 2020-06-22 MED ORDER — 0.9 % SODIUM CHLORIDE (POUR BTL) OPTIME
TOPICAL | Status: DC | PRN
  Administered 2020-06-22: 1000 mL

## 2020-06-22 MED ORDER — KCL IN DEXTROSE-NACL 40-5-0.9 MEQ/L-%-% IV SOLN
INTRAVENOUS | Status: DC
  Filled 2020-06-22 (×2): qty 1000

## 2020-06-22 MED ORDER — ACETAMINOPHEN 500 MG PO TABS
1000.0000 mg | ORAL_TABLET | Freq: Four times a day (QID) | ORAL | Status: DC
  Administered 2020-06-22 – 2020-06-23 (×5): 1000 mg via ORAL
  Filled 2020-06-22 (×5): qty 2

## 2020-06-22 MED ORDER — SUCCINYLCHOLINE CHLORIDE 200 MG/10ML IV SOSY
PREFILLED_SYRINGE | INTRAVENOUS | Status: AC
  Filled 2020-06-22: qty 10

## 2020-06-22 MED ORDER — GABAPENTIN 300 MG PO CAPS
300.0000 mg | ORAL_CAPSULE | ORAL | Status: AC
  Administered 2020-06-22: 300 mg via ORAL
  Filled 2020-06-22: qty 1

## 2020-06-22 MED ORDER — ONDANSETRON HCL 4 MG/2ML IJ SOLN
INTRAMUSCULAR | Status: AC
  Filled 2020-06-22: qty 2

## 2020-06-22 MED ORDER — CHLORHEXIDINE GLUCONATE 0.12 % MT SOLN
OROMUCOSAL | Status: AC
  Administered 2020-06-22: 15 mL via OROMUCOSAL
  Filled 2020-06-22: qty 15

## 2020-06-22 MED ORDER — IBUPROFEN 400 MG PO TABS: 400.0000 mg | ORAL_TABLET | Freq: Four times a day (QID) | ORAL | Status: DC | PRN

## 2020-06-22 MED ORDER — SUCCINYLCHOLINE CHLORIDE 20 MG/ML IJ SOLN
INTRAMUSCULAR | Status: DC | PRN
  Administered 2020-06-22: 140 mg via INTRAVENOUS

## 2020-06-22 MED ORDER — CHLORHEXIDINE GLUCONATE 0.12 % MT SOLN
15.0000 mL | OROMUCOSAL | Status: AC
  Filled 2020-06-22: qty 15

## 2020-06-22 MED ORDER — ROCURONIUM BROMIDE 10 MG/ML (PF) SYRINGE
PREFILLED_SYRINGE | INTRAVENOUS | Status: DC | PRN
  Administered 2020-06-22: 50 mg via INTRAVENOUS

## 2020-06-22 MED ORDER — SODIUM CHLORIDE 0.9 % IV SOLN
2.0000 g | INTRAVENOUS | Status: DC
  Filled 2020-06-22: qty 20

## 2020-06-22 MED ORDER — HYDROMORPHONE HCL 1 MG/ML IJ SOLN
1.0000 mg | INTRAMUSCULAR | Status: DC | PRN
  Administered 2020-06-22 (×3): 1 mg via INTRAVENOUS
  Filled 2020-06-22 (×3): qty 1

## 2020-06-22 MED ORDER — SUGAMMADEX SODIUM 200 MG/2ML IV SOLN
INTRAVENOUS | Status: DC | PRN
  Administered 2020-06-22: 200 mg via INTRAVENOUS

## 2020-06-22 MED ORDER — LIDOCAINE 2% (20 MG/ML) 5 ML SYRINGE
INTRAMUSCULAR | Status: AC
  Filled 2020-06-22: qty 5

## 2020-06-22 MED ORDER — ACETAMINOPHEN 500 MG PO TABS
1000.0000 mg | ORAL_TABLET | ORAL | Status: AC
  Administered 2020-06-22: 1000 mg via ORAL
  Filled 2020-06-22: qty 2

## 2020-06-22 MED ORDER — ROCURONIUM BROMIDE 10 MG/ML (PF) SYRINGE
PREFILLED_SYRINGE | INTRAVENOUS | Status: AC
  Filled 2020-06-22: qty 10

## 2020-06-22 MED ORDER — ONDANSETRON HCL 4 MG/2ML IJ SOLN
4.0000 mg | INTRAMUSCULAR | Status: AC
  Administered 2020-06-22: 4 mg via INTRAVENOUS
  Filled 2020-06-22: qty 2

## 2020-06-22 MED ORDER — MIDAZOLAM HCL 5 MG/5ML IJ SOLN
INTRAMUSCULAR | Status: DC | PRN
  Administered 2020-06-22: 1 mg via INTRAVENOUS

## 2020-06-22 MED ORDER — DEXAMETHASONE SODIUM PHOSPHATE 10 MG/ML IJ SOLN
INTRAMUSCULAR | Status: AC
  Filled 2020-06-22: qty 1

## 2020-06-22 MED ORDER — ONDANSETRON HCL 4 MG/2ML IJ SOLN
INTRAMUSCULAR | Status: DC | PRN
  Administered 2020-06-22: 4 mg via INTRAVENOUS

## 2020-06-22 MED ORDER — ONDANSETRON HCL 4 MG/2ML IJ SOLN
4.0000 mg | Freq: Four times a day (QID) | INTRAMUSCULAR | Status: DC | PRN
  Administered 2020-06-22: 4 mg via INTRAVENOUS
  Filled 2020-06-22 (×2): qty 2

## 2020-06-22 MED ORDER — BUPIVACAINE-EPINEPHRINE (PF) 0.25% -1:200000 IJ SOLN
INTRAMUSCULAR | Status: AC
  Filled 2020-06-22: qty 30

## 2020-06-22 MED ORDER — PROPOFOL 10 MG/ML IV BOLUS
INTRAVENOUS | Status: AC
  Filled 2020-06-22: qty 20

## 2020-06-22 MED ORDER — MIDAZOLAM HCL 2 MG/2ML IJ SOLN
INTRAMUSCULAR | Status: AC
  Filled 2020-06-22: qty 2

## 2020-06-22 MED ORDER — SODIUM CHLORIDE 0.9 % IV SOLN
2.0000 g | Freq: Once | INTRAVENOUS | Status: AC
  Administered 2020-06-22: 2 g via INTRAVENOUS
  Filled 2020-06-22: qty 20

## 2020-06-22 MED ORDER — MORPHINE SULFATE (PF) 2 MG/ML IV SOLN
2.0000 mg | INTRAVENOUS | Status: DC | PRN
  Administered 2020-06-22: 2 mg via INTRAVENOUS
  Filled 2020-06-22 (×2): qty 1

## 2020-06-22 MED ORDER — DEXAMETHASONE SODIUM PHOSPHATE 10 MG/ML IJ SOLN
INTRAMUSCULAR | Status: DC | PRN
  Administered 2020-06-22: 8 mg via INTRAVENOUS

## 2020-06-22 MED ORDER — BUPIVACAINE-EPINEPHRINE 0.25% -1:200000 IJ SOLN
INTRAMUSCULAR | Status: DC | PRN
  Administered 2020-06-22: 16 mL

## 2020-06-22 MED ORDER — SCOPOLAMINE 1 MG/3DAYS TD PT72
1.0000 | MEDICATED_PATCH | TRANSDERMAL | Status: DC
  Administered 2020-06-22: 1.5 mg via TRANSDERMAL
  Filled 2020-06-22: qty 1

## 2020-06-22 MED ORDER — OXYCODONE HCL 5 MG PO TABS
5.0000 mg | ORAL_TABLET | Freq: Four times a day (QID) | ORAL | Status: DC | PRN
  Filled 2020-06-22: qty 1

## 2020-06-22 MED ORDER — METOCLOPRAMIDE HCL 5 MG/ML IJ SOLN
10.0000 mg | Freq: Once | INTRAMUSCULAR | Status: AC
  Administered 2020-06-22: 10 mg via INTRAVENOUS
  Filled 2020-06-22: qty 2

## 2020-06-22 MED ORDER — PROPOFOL 10 MG/ML IV BOLUS
INTRAVENOUS | Status: DC | PRN
  Administered 2020-06-22: 130 mg via INTRAVENOUS

## 2020-06-22 MED ORDER — LACTATED RINGERS IV SOLN: INTRAVENOUS | Status: DC

## 2020-06-22 MED ORDER — ONDANSETRON 4 MG PO TBDP: 4.0000 mg | ORAL_TABLET | Freq: Four times a day (QID) | ORAL | Status: DC | PRN

## 2020-06-22 MED ORDER — OXYCODONE HCL 5 MG PO TABS
5.0000 mg | ORAL_TABLET | ORAL | Status: DC | PRN
  Administered 2020-06-22 – 2020-06-23 (×5): 5 mg via ORAL
  Filled 2020-06-22 (×4): qty 1

## 2020-06-22 MED ORDER — ACETAMINOPHEN 10 MG/ML IV SOLN: 1000.0000 mg | Freq: Once | INTRAVENOUS | Status: DC

## 2020-06-22 MED ORDER — PHENYLEPHRINE 40 MCG/ML (10ML) SYRINGE FOR IV PUSH (FOR BLOOD PRESSURE SUPPORT)
PREFILLED_SYRINGE | INTRAVENOUS | Status: AC
  Filled 2020-06-22: qty 10

## 2020-06-22 MED ORDER — FENTANYL CITRATE (PF) 250 MCG/5ML IJ SOLN
INTRAMUSCULAR | Status: AC
  Filled 2020-06-22: qty 5

## 2020-06-22 SURGICAL SUPPLY — 38 items
APPLIER CLIP 5 13 M/L LIGAMAX5 (MISCELLANEOUS) ×2
BLADE CLIPPER SURG (BLADE) IMPLANT
CANISTER SUCT 3000ML PPV (MISCELLANEOUS) ×2 IMPLANT
CHLORAPREP W/TINT 26 (MISCELLANEOUS) ×2 IMPLANT
CLIP APPLIE 5 13 M/L LIGAMAX5 (MISCELLANEOUS) ×1 IMPLANT
COVER SURGICAL LIGHT HANDLE (MISCELLANEOUS) ×2 IMPLANT
COVER WAND RF STERILE (DRAPES) ×2 IMPLANT
DERMABOND ADVANCED (GAUZE/BANDAGES/DRESSINGS) ×1
DERMABOND ADVANCED .7 DNX12 (GAUZE/BANDAGES/DRESSINGS) ×1 IMPLANT
DISSECTOR BLUNT TIP ENDO 5MM (MISCELLANEOUS) ×2 IMPLANT
ELECT REM PT RETURN 9FT ADLT (ELECTROSURGICAL) ×2
ELECTRODE REM PT RTRN 9FT ADLT (ELECTROSURGICAL) ×1 IMPLANT
GLOVE BIO SURGEON STRL SZ7.5 (GLOVE) ×2 IMPLANT
GLOVE INDICATOR 8.0 STRL GRN (GLOVE) ×2 IMPLANT
GOWN STRL REUS W/ TWL LRG LVL3 (GOWN DISPOSABLE) ×2 IMPLANT
GOWN STRL REUS W/ TWL XL LVL3 (GOWN DISPOSABLE) ×1 IMPLANT
GOWN STRL REUS W/TWL LRG LVL3 (GOWN DISPOSABLE) ×2
GOWN STRL REUS W/TWL XL LVL3 (GOWN DISPOSABLE) ×1
GRASPER SUT TROCAR 14GX15 (MISCELLANEOUS) ×2 IMPLANT
KIT BASIN OR (CUSTOM PROCEDURE TRAY) ×2 IMPLANT
KIT TURNOVER KIT B (KITS) ×2 IMPLANT
L-HOOK LAP DISP 36CM (ELECTROSURGICAL) ×2
LHOOK LAP DISP 36CM (ELECTROSURGICAL) ×1 IMPLANT
NS IRRIG 1000ML POUR BTL (IV SOLUTION) ×2 IMPLANT
PAD ARMBOARD 7.5X6 YLW CONV (MISCELLANEOUS) ×2 IMPLANT
PENCIL BUTTON HOLSTER BLD 10FT (ELECTRODE) ×2 IMPLANT
POUCH SPECIMEN RETRIEVAL 10MM (ENDOMECHANICALS) IMPLANT
SCISSORS LAP 5X35 DISP (ENDOMECHANICALS) ×2 IMPLANT
SET IRRIG TUBING LAPAROSCOPIC (IRRIGATION / IRRIGATOR) ×2 IMPLANT
SET TUBE SMOKE EVAC HIGH FLOW (TUBING) ×2 IMPLANT
SPECIMEN JAR SMALL (MISCELLANEOUS) ×2 IMPLANT
SUT MNCRL AB 4-0 PS2 18 (SUTURE) ×2 IMPLANT
TOWEL GREEN STERILE (TOWEL DISPOSABLE) ×2 IMPLANT
TOWEL GREEN STERILE FF (TOWEL DISPOSABLE) ×2 IMPLANT
TRAY LAPAROSCOPIC MC (CUSTOM PROCEDURE TRAY) ×2 IMPLANT
TROCAR ADV FIXATION 5X100MM (TROCAR) ×6 IMPLANT
TROCAR XCEL BLUNT TIP 100MML (ENDOMECHANICALS) ×2 IMPLANT
WATER STERILE IRR 1000ML POUR (IV SOLUTION) ×2 IMPLANT

## 2020-06-22 NOTE — Progress Notes (Signed)
Rebecca Lester is a 30 y.o. female patient admitted from ED. Awake, alert - oriented  X 4 - no acute distress noted.  VSS - Blood pressure (!) 127/97, pulse 91, temperature 97.7 F (36.5 C), temperature source Oral, resp. rate 18, height 4' 11.5" (1.511 m), weight 65.8 kg, SpO2 100 %.    IV in place, occlusive dsg intact without redness.    Will cont to eval and treat per MD orders.  Rolland Porter, RN 06/22/2020 4:40 AM

## 2020-06-22 NOTE — Op Note (Signed)
06/21/2020 - 06/22/2020 12:22 PM  PATIENT: Rebecca Lester  30 y.o. female  Patient Care Team: Pcp, No as PCP - General  PRE-OPERATIVE DIAGNOSIS: Acute cholecystitis with hydrops gallbladder  POST-OPERATIVE DIAGNOSIS: Same  PROCEDURE: Laparoscopic cholecystectomy  SURGEON: Stephanie Coup. Tomasz Steeves, MD  ASSISTANT: Trixie Deis, PA-C  ANESTHESIA: General endotracheal  EBL: Total I/O In: 1000 [I.V.:1000] Out: 10 [Blood:10]  DRAINS: None  SPECIMEN: Gallbladder  COUNTS: Sponge, needle and instrument counts were reported correct x2 at the conclusion of the operation  DISPOSITION: PACU in satisfactory condition  COMPLICATIONS: None  FINDINGS: Acutely inflamed gallbladder with hyperemia, pericholecystic fluid and hydrops.  DESCRIPTION:  The patient was identified & brought into the operating room. She was then positioned supine on the OR table. SCDs were in place and active during the entire case. She then underwent general endotracheal anesthesia. Pressure points were padded. Hair on the abdomen was clipped by the OR team. The abdomen was prepped and draped in the standard sterile fashion. Antibiotics were administered. A surgical timeout was performed and confirmed our plan.  A periumbilical incision was made. The umbilical stalk was grasped and retracted outwardly. The supraumbilical fascia was identified and incised. The peritoneal cavity was gently entered bluntly. A purse-string 0 Vicryl suture was placed. The Hasson cannula was inserted into the peritoneal cavity and insufflation with CO2 commenced to . A laparoscope was inserted into the peritoneal cavity and inspection confirmed no evidence of trocar site complications. The patient was then positioned in reverse Trendelenburg with slight left side down. 3 additional 70mm trocars were placed along the right subcostal line - one 17mm port in mid subcostal region, another 5mm port in the right flank near the anterior axillary line, and  a third 81mm port in the left subxiphoid region obliquely near the falciform ligament.  The liver and gallbladder were inspected. The liver is normal in appearance. There is pericholecystic fluid and adhesions to the infundibulum of the gallbladder. The gallbladder fundus was attempted to be grasped but was too tense.  Therefore, a Nezhat was used to decompress the gallbladder.  Clearish turbid fluid was evacuated from the gallbladder.  The gallbladder was then grasped and elevated cephalad. An additional grasper was then placed on the infundibulum of the gallbladder and the infundibulum was retracted laterally. There is a large stone impacted in the infundibulum. Staying high on the gallbladder, the peritoneum on both sides of the gallbladder was opened with hook cautery. Gentle blunt dissection was then employed with a Art gallery manager working down into [employer]. The cystic duct was identified and carefully circumferentially dissected. The cystic artery was also identified and carefully circumferentially dissected. The space between the cystic artery and hepatocystic plate was developed such that a good view of the liver could be seen through a window medial to the cystic artery. The triangle of Calot had been cleared of all fibrofatty tissue. At this point, a critical view of safety was achieved and the only structures visualized was the skeletonized cystic duct laterally, the skeletonized cystic artery and the liver through the window medial to the artery. No posterior cystic artery was noted  A cholangiogram was not performed at this point as her cystic duct is quite small and fragile; concern for potential avulsion during attempted cholangiogram catheter advancement.  The cystic duct and artery were clipped with 2 clips on the patient side and 1 clip on the specimen side. The cystic duct and artery were then divided. The gallbladder was then freed from its  remaining attachments to the liver  using electrocautery.  Midway up the gallbladder, there was some arterial bleeding from the surface of the liver but this was well away from the porta. This was controlled with a clip. This was observed and hemostatic. This is likely a diminutive posterior cystic artery. The gallbladder was then placed into an endocatch bag. The RUQ was gently irrigated with sterile saline. Hemostasis was then verified. The clips were in good position; the gallbladder fossa was dry.   The endocatch bag containing the gallbladder was then removed from the umbilical port site and passed off as specimen. The umbilical fascia was then closed using the 0 Vicryl purse-string suture; an additional 0 Vicryl suture with a laparoscopic suture passer was placed. The fascia was palpated and noted to be completely closed. The RUQ ports were removed and hemostatic. Pneumoperitoneum had been exhausted from the abdomen. The skin of all incision sites was approximated with 4-0 monocryl subcuticular suture and dermabond applied. She was then awakened from anesthesia, extubated, and transferred to a stretcher for transport to PACU in satisfactory condition.

## 2020-06-22 NOTE — ED Notes (Addendum)
Spoke with Dr. Derrell Lolling as requested by Patient Placement, admitting order to be changed to [facility], not [facility].

## 2020-06-22 NOTE — Transfer of Care (Signed)
Immediate Anesthesia Transfer of Care Note  Patient: Rebecca Lester  Procedure(s) Performed: LAPAROSCOPIC CHOLECYSTECTOMY (N/A Abdomen)  Patient Location: PACU  Anesthesia Type:General  Level of Consciousness: awake and drowsy  Airway & Oxygen Therapy: Patient Spontanous Breathing and Patient connected to nasal cannula oxygen  Post-op Assessment: Report given to RN and Post -op Vital signs reviewed and stable  Post vital signs: Reviewed and stable  Last Vitals:  Vitals Value Taken Time  BP 114/69 06/22/20 1241  Temp    Pulse 88 06/22/20 1243  Resp 19 06/22/20 1243  SpO2 100 % 06/22/20 1243  Vitals shown include unvalidated device data.  Last Pain:  Vitals:   06/22/20 0505  TempSrc:   PainSc: Asleep         Complications: No complications documented.

## 2020-06-22 NOTE — H&P (Signed)
Admission Note  Rebecca Lester Aug 18, 1990  [id number].    Requesting MD: Leonia Corona PA-C Chief Complaint/Reason for Consult: Cholecystitis HPI:  Patient is a 30 year old female who presented to [facility] with RUQ abdominal pain that started about 5 days ago. Pain has waxed and waned since onset but never fully resolved. Pain described as sharp and non-radiating. Associated nausea with bilious emesis. Patient denies experiencing pain like this ever previously. Denies fever, chills, chest pain, SOB, urinary symptoms. PMH otherwise significant for GERD, Asthma, PCOS. No past abdominal surgery. NKDA. She does not take any blood thinning medications. She reports rare alcohol use and denies tobacco or illicit drug use. She is not currently working.   ROS: Review of Systems  Constitutional: Negative for chills and fever.  Respiratory: Negative for shortness of breath and wheezing.   Cardiovascular: Negative for chest pain and palpitations.  Gastrointestinal: Positive for abdominal pain, diarrhea, nausea and vomiting. Negative for constipation.  Genitourinary: Negative for dysuria, frequency and urgency.  All other systems reviewed and are negative.   History reviewed. No pertinent family history.  History reviewed. No pertinent past medical history.  History reviewed. No pertinent surgical history.  Social History:  has no history on file for tobacco use, alcohol use, and drug use.  Allergies: No Known Allergies  No medications prior to admission.    Blood pressure (!) 127/97, pulse 91, temperature 97.7 F (36.5 C), temperature source Oral, resp. rate 18, height 4' 11.5" (1.511 m), weight 65.8 kg, SpO2 100 %. Physical Exam:  General: pleasant, WD, WN female who is laying in bed in NAD HEENT: Sclera are anicteric.  PERRL.  Ears and nose without any masses or lesions.  Mouth is pink and moist Heart: regular, rate, and rhythm.  Normal s1,s2. No obvious murmurs, gallops, or rubs  noted.  Palpable radial and pedal pulses bilaterally Lungs: CTAB, no wheezes, rhonchi, or rales noted.  Respiratory effort nonlabored Abd: soft, ttp in RUQ with positive Murphy sign, ND, +BS, no masses, hernias, or organomegaly MS: all 4 extremities are symmetrical with no cyanosis, clubbing, or edema. Skin: warm and dry with no masses, lesions, or rashes Neuro: Cranial nerves 2-12 grossly intact, sensation is normal throughout Psych: A&Ox3 with an appropriate affect.   Results for orders placed or performed during the hospital encounter of 06/21/20 (from the past 48 hour(s))  CBC with Differential     Status: Abnormal   Collection Time: 06/21/20  7:26 PM  Result Value Ref Range   WBC 11.4 (H) 4.0 - 10.5 K/uL   RBC 4.06 3.87 - 5.11 MIL/uL   Hemoglobin 12.5 12.0 - 15.0 g/dL   HCT 43.1 54.0 - 08.6 %   MCV 94.8 80.0 - 100.0 fL   MCH 30.8 26.0 - 34.0 pg   MCHC 32.5 30.0 - 36.0 g/dL   RDW 76.1 95.0 - 93.2 %   Platelets 373 150 - 400 K/uL   nRBC 0.0 0.0 - 0.2 %   Neutrophils Relative % 75 %   Neutro Abs 8.5 (H) 1.7 - 7.7 K/uL   Lymphocytes Relative 18 %   Lymphs Abs 2.1 0.7 - 4.0 K/uL   Monocytes Relative 5 %   Monocytes Absolute 0.6 0.1 - 1.0 K/uL   Eosinophils Relative 1 %   Eosinophils Absolute 0.1 0.0 - 0.5 K/uL   Basophils Relative 1 %   Basophils Absolute 0.1 0.0 - 0.1 K/uL   Immature Granulocytes 0 %   Abs Immature  Granulocytes 0.03 0.00 - 0.07 K/uL    Comment: Performed at [facility], [address], [place], [place] [address]  Comprehensive metabolic panel     Status: Abnormal   Collection Time: 06/21/20  7:26 PM  Result Value Ref Range   Sodium 136 135 - 145 mmol/L   Potassium 4.2 3.5 - 5.1 mmol/L   Chloride 103 98 - 111 mmol/L   CO2 26 22 - 32 mmol/L   Glucose, Bld 95 70 - 99 mg/dL    Comment: Glucose reference range applies only to samples taken after fasting for at least 8 hours.   BUN 8 6 - 20 mg/dL   Creatinine, Ser 4.65 0.44 - 1.00 mg/dL   Calcium 9.0  8.9 - 68.1 mg/dL   Total Protein 6.9 6.5 - 8.1 g/dL   Albumin 3.3 (L) 3.5 - 5.0 g/dL   AST 21 15 - 41 U/L   ALT 16 0 - 44 U/L   Alkaline Phosphatase 65 38 - 126 U/L   Total Bilirubin 0.2 (L) 0.3 - 1.2 mg/dL   GFR, Estimated >27 >51 mL/min    Comment: (NOTE) Calculated using the CKD-EPI Creatinine Equation (2021)    Anion gap 7 5 - 15    Comment: Performed at [facility], [address], [place], [place] [address]  Lipase, blood     Status: None   Collection Time: 06/21/20  7:26 PM  Result Value Ref Range   Lipase 25 11 - 51 U/L    Comment: Performed at [facility], [address] [place], [place] [address]  Urinalysis, Routine w reflex microscopic Urine, Clean Catch     Status: Abnormal   Collection Time: 06/21/20  8:00 PM  Result Value Ref Range   Color, Urine YELLOW YELLOW   APPearance CLEAR CLEAR   Specific Gravity, Urine 1.024 1.005 - 1.030   pH 5.0 5.0 - 8.0   Glucose, UA NEGATIVE NEGATIVE mg/dL   Hgb urine dipstick SMALL (A) NEGATIVE   Bilirubin Urine NEGATIVE NEGATIVE   Ketones, ur 80 (A) NEGATIVE mg/dL   Protein, ur NEGATIVE NEGATIVE mg/dL   Nitrite NEGATIVE NEGATIVE   Leukocytes,Ua NEGATIVE NEGATIVE   RBC / HPF 0-5 0 - 5 RBC/hpf   WBC, UA 0-5 0 - 5 WBC/hpf   Bacteria, UA RARE (A) NONE SEEN   Squamous Epithelial / LPF 0-5 0 - 5   Mucus PRESENT     Comment: Performed at [facility], [address], [place], [place] [address]  I-Stat Beta hCG blood, ED ([facility], [facility], [facility] only)     Status: None   Collection Time: 06/21/20  8:17 PM  Result Value Ref Range   I-stat hCG, quantitative <5.0 <5 mIU/mL   Comment 3            Comment:   GEST. AGE      CONC.  (mIU/mL)   <=1 WEEK        5 - 50     2 WEEKS       50 - 500     3 WEEKS       100 - 10,000     4 WEEKS     1,000 - 30,000        FEMALE AND NON-PREGNANT FEMALE:     LESS THAN 5 mIU/mL   Resp Panel by RT-PCR (Flu A&B, Covid) Nasopharyngeal Swab     Status: None   Collection Time: 06/22/20  12:11 AM    Specimen: Nasopharyngeal Swab; Nasopharyngeal(NP) swabs in vial transport medium  Result Value Ref Range   SARS Coronavirus 2 by RT PCR NEGATIVE NEGATIVE    Comment: (NOTE) SARS-CoV-2 target nucleic acids are NOT DETECTED.  The SARS-CoV-2 RNA is generally detectable in upper respiratory specimens during the acute phase of infection. The lowest concentration of SARS-CoV-2 viral copies this assay can detect is 138 copies/mL. A negative result does not preclude SARS-Cov-2 infection and should not be used as the sole basis for treatment or other patient management decisions. A negative result may occur with  improper specimen collection/handling, submission of specimen other than nasopharyngeal swab, presence of viral mutation(s) within the areas targeted by this assay, and inadequate number of viral copies(<138 copies/mL). A negative result must be combined with clinical observations, patient history, and epidemiological information. The expected result is Negative.  Fact Sheet for Patients:  BloggerCourse.com  Fact Sheet for Healthcare Providers:  SeriousBroker.it  This test is no t yet approved or cleared by the [place] FDA and  has been authorized for detection and/or diagnosis of SARS-CoV-2 by FDA under an Emergency Use Authorization (EUA). This EUA will remain  in effect (meaning this test can be used) for the duration of the COVID-19 declaration under Section 564(b)(1) of the Act, 21 U.S.C.section 360bbb-3(b)(1), unless the authorization is terminated  or revoked sooner.       Influenza A by PCR NEGATIVE NEGATIVE   Influenza B by PCR NEGATIVE NEGATIVE    Comment: (NOTE) The Xpert Xpress SARS-CoV-2/FLU/RSV plus assay is intended as an aid in the diagnosis of influenza from Nasopharyngeal swab specimens and should not be used as a sole basis for treatment. Nasal washings and aspirates are unacceptable for Xpert Xpress  SARS-CoV-2/FLU/RSV testing.  Fact Sheet for Patients: BloggerCourse.com  Fact Sheet for Healthcare Providers: SeriousBroker.it  This test is not yet approved or cleared by the [place] FDA and has been authorized for detection and/or diagnosis of SARS-CoV-2 by FDA under an Emergency Use Authorization (EUA). This EUA will remain in effect (meaning this test can be used) for the duration of the COVID-19 declaration under Section 564(b)(1) of the Act, 21 U.S.C. section 360bbb-3(b)(1), unless the authorization is terminated or revoked.  Performed at [facility], [address] [place], [place] [address]   Comprehensive metabolic panel     Status: Abnormal   Collection Time: 06/22/20  3:32 AM  Result Value Ref Range   Sodium 135 135 - 145 mmol/L   Potassium 3.9 3.5 - 5.1 mmol/L   Chloride 105 98 - 111 mmol/L   CO2 23 22 - 32 mmol/L   Glucose, Bld 132 (H) 70 - 99 mg/dL    Comment: Glucose reference range applies only to samples taken after fasting for at least 8 hours.   BUN 5 (L) 6 - 20 mg/dL   Creatinine, Ser 4.96 0.44 - 1.00 mg/dL   Calcium 8.2 (L) 8.9 - 10.3 mg/dL   Total Protein 6.1 (L) 6.5 - 8.1 g/dL   Albumin 2.9 (L) 3.5 - 5.0 g/dL   AST 19 15 - 41 U/L   ALT 17 0 - 44 U/L   Alkaline Phosphatase 63 38 - 126 U/L   Total Bilirubin 0.4 0.3 - 1.2 mg/dL   GFR, Estimated >75 >91 mL/min    Comment: (NOTE) Calculated using the CKD-EPI Creatinine Equation (2021)    Anion gap 7 5 - 15    Comment: Performed at [facility], [address] [place], [place] [address]  CBC     Status: Abnormal   Collection Time: 06/22/20  3:32 AM  Result Value Ref Range   WBC 13.2 (H) 4.0 - 10.5 K/uL   RBC 3.77 (L) 3.87 - 5.11 MIL/uL   Hemoglobin 11.9 (L) 12.0 - 15.0 g/dL   HCT 60.435.7 (L) 54.036.0 - 98.146.0 %   MCV 94.7 80.0 - 100.0 fL   MCH 31.6 26.0 - 34.0 pg   MCHC 33.3 30.0 - 36.0 g/dL   RDW 19.112.5 47.811.5 - 29.515.5 %   Platelets 332 150  - 400 K/uL   nRBC 0.0 0.0 - 0.2 %    Comment: Performed at [facility], [address] [place], [place] [address]  Surgical pcr screen     Status: None   Collection Time: 06/22/20  4:42 AM   Specimen: Nasal Mucosa; Nasal Swab  Result Value Ref Range   MRSA, PCR NEGATIVE NEGATIVE   Staphylococcus aureus NEGATIVE NEGATIVE    Comment: (NOTE) The Xpert SA Assay (FDA approved for NASAL specimens in patients 31 years of age and older), is one component of a comprehensive surveillance program. It is not intended to diagnose infection nor to guide or monitor treatment. Performed at [facility], [address] [place], [place] [address]    CT ABDOMEN PELVIS W CONTRAST  Result Date: 06/21/2020 CLINICAL DATA:  Right lower quadrant abdominal pain. EXAM: CT ABDOMEN AND PELVIS WITH CONTRAST TECHNIQUE: Multidetector CT imaging of the abdomen and pelvis was performed using the standard protocol following bolus administration of intravenous contrast. CONTRAST:  100mL OMNIPAQUE IOHEXOL 300 MG/ML  SOLN COMPARISON:  None. FINDINGS: Lower chest: No acute abnormality. Hepatobiliary: No focal liver abnormality. Nonspecific hydropic gallbladder measuring up to 4 cm on axial imaging. No gallstones, gallbladder wall thickening, or pericholecystic fluid. No biliary dilatation. Pancreas: No focal lesion. Normal pancreatic contour. No surrounding inflammatory changes. No main pancreatic ductal dilatation. Spleen: Normal in size without focal abnormality. Adrenals/Urinary Tract: No adrenal nodule bilaterally. Bilateral kidneys enhance symmetrically. No hydronephrosis. No hydroureter. The urinary bladder is decompressed and grossly unremarkable. Stomach/Bowel: Stomach is within normal limits. No evidence of bowel wall thickening or dilatation. Appendix appears normal. Vascular/Lymphatic: No significant vascular findings are present. No enlarged abdominal or pelvic lymph nodes. Reproductive: Uterus and bilateral  adnexa are unremarkable. Other: No intraperitoneal free fluid. No intraperitoneal free gas. No organized fluid collection. Musculoskeletal: No acute or significant osseous findings. IMPRESSION: 1. Nonspecific hydropic gallbladder with otherwise no acute intra-abdominal or intrapelvic abnormality. If clinically indicated, consider right upper quadrant ultrasound for a more sensitive evaluation of the gallbladder. 2. Normal appendix. Electronically Signed   By: Tish FredericksonMorgane  Naveau M.D.   On: 06/21/2020 22:29   US Abdomen Limited RUQ (LIVER/GB)  Result Date: 06/21/2020 CLINICAL DATA:  Right upper quadrant abdominal pain EXAM: ULTRASOUND ABDOMEN LIMITED RIGHT UPPER QUADRANT COMPARISON:  CT from same day FINDINGS: Gallbladder: There is cholelithiasis with mild borderline gallbladder wall thickening. The gallbladder is distended. The sonographic Eulah PontMurphy sign is positive. There is no pericholecystic free fluid. Common bile duct: Diameter: 3 mm Liver: No focal lesion identified. Within normal limits in parenchymal echogenicity. Portal vein is patent on color Doppler imaging with normal direction of blood flow towards the liver. Other: None. IMPRESSION: Cholelithiasis with findings suspicious for acute cholecystitis. Surgical consultation is recommended. If imaging findings are discordant with laboratory and physical exam findings, a HIDA scan would be useful for further  evaluation. Electronically Signed   By: Katherine Mantle M.D.   On: 06/21/2020 23:50      Assessment/Plan GERD Asthma PCOS  Acute cholecystitis  - RUQ [place] suggestive of cholecystitis  - WBC mildly elevated at 13K, LFTs not elevated - positive Murphy sign on exam - to OR later this AM for laparoscopic cholecystectomy  - admit for observation   Juliet Rude, [facility] Surgery 06/22/2020, 7:42 AM Please see Amion for pager number during day hours 7:00am-4:30pm

## 2020-06-22 NOTE — Anesthesia Postprocedure Evaluation (Signed)
Anesthesia Post Note  Patient: Rebecca Lester  Procedure(s) Performed: LAPAROSCOPIC CHOLECYSTECTOMY (N/A Abdomen)     Patient location during evaluation: PACU Anesthesia Type: General Level of consciousness: awake and alert Pain management: pain level controlled Vital Signs Assessment: post-procedure vital signs reviewed and stable Respiratory status: spontaneous breathing, nonlabored ventilation, respiratory function stable and patient connected to nasal cannula oxygen Cardiovascular status: blood pressure returned to baseline and stable Postop Assessment: no apparent nausea or vomiting Anesthetic complications: no   No complications documented.  Last Vitals:  Vitals:   06/22/20 1311 06/22/20 1326  BP: 122/67 116/65  Pulse: 94 80  Resp: 18 16  Temp:    SpO2: 100% 99%    Last Pain:  Vitals:   06/22/20 1241  TempSrc:   PainSc: 0-No pain                 Kidus Delman

## 2020-06-22 NOTE — Anesthesia Procedure Notes (Signed)
Procedure Name: Intubation Date/Time: 06/22/2020 11:00 AM Performed by: Inda Coke, CRNA Pre-anesthesia Checklist: Patient identified, Emergency Drugs available, Suction available and Patient being monitored Patient Re-evaluated:Patient Re-evaluated prior to induction Oxygen Delivery Method: Circle System Utilized Preoxygenation: Pre-oxygenation with 100% oxygen Induction Type: IV induction and Rapid sequence Laryngoscope Size: Mac and 3 Grade View: Grade I Tube type: Oral Tube size: 7.0 mm Number of attempts: 1 Airway Equipment and Method: Stylet and Oral airway Placement Confirmation: ETT inserted through vocal cords under direct vision,  positive ETCO2 and breath sounds checked- equal and bilateral Secured at: 21 cm Tube secured with: Tape Dental Injury: Teeth and Oropharynx as per pre-operative assessment

## 2020-06-22 NOTE — Progress Notes (Signed)
Rebecca Lester has returned from her laparoscopic cholecystectomy and is alert & oriented x 4. She has 4 lap sites, closed with skin glue with scant drainage. RN Aneta Mins given report stated that patient already voided . V/s taken and stable upon return. Pain rated 7/10, 1mg  IV dilaudid administered. Will continue to monitor and assess.

## 2020-06-22 NOTE — Anesthesia Preprocedure Evaluation (Addendum)
Anesthesia Evaluation  Patient identified by MRN, date of birth, ID band Patient awake    Reviewed: Allergy & Precautions, H&P , NPO status , Patient's Chart, lab work & pertinent test results, reviewed documented beta blocker date and time   Airway Mallampati: II  TM Distance: >3 FB Neck ROM: full    Dental no notable dental hx. (+) Teeth Intact, Dental Advisory Given   Pulmonary    Pulmonary exam normal breath sounds clear to auscultation       Cardiovascular Exercise Tolerance: Good  Rhythm:regular Rate:Normal     Neuro/Psych negative neurological ROS     GI/Hepatic   Endo/Other    Renal/GU   negative genitourinary   Musculoskeletal   Abdominal   Peds  Hematology   Anesthesia Other Findings   Reproductive/Obstetrics                            Anesthesia Physical Anesthesia Plan  ASA: II  Anesthesia Plan: General   Post-op Pain Management:    Induction: Intravenous and Cricoid pressure planned  PONV Risk Score and Plan: 3 and Ondansetron, Dexamethasone, Treatment may vary due to age or medical condition and Midazolam  Airway Management Planned: Oral ETT  Additional Equipment:   Intra-op Plan:   Post-operative Plan: Extubation in OR  Informed Consent: I have reviewed the patients History and Physical, chart, labs and discussed the procedure including the risks, benefits and alternatives for the proposed anesthesia with the patient or authorized representative who has indicated his/her understanding and acceptance.     Dental Advisory Given  Plan Discussed with: CRNA and Anesthesiologist  Anesthesia Plan Comments: (  )       Anesthesia Quick Evaluation

## 2020-06-22 NOTE — ED Notes (Signed)
Patient ambulatory to and from restroom with steady gait.

## 2020-06-23 ENCOUNTER — Encounter (HOSPITAL_COMMUNITY): Payer: Self-pay | Admitting: Surgery

## 2020-06-23 LAB — SURGICAL PATHOLOGY

## 2020-06-23 MED ORDER — IBUPROFEN 200 MG PO TABS: 400.0000 mg | ORAL_TABLET | Freq: Four times a day (QID) | ORAL | Status: AC | PRN

## 2020-06-23 MED ORDER — OXYCODONE HCL 5 MG PO TABS: 5.0000 mg | ORAL_TABLET | Freq: Four times a day (QID) | ORAL | 0 refills | Status: AC | PRN

## 2020-06-23 MED ORDER — ACETAMINOPHEN 500 MG PO TABS: 1000.0000 mg | ORAL_TABLET | Freq: Three times a day (TID) | ORAL | Status: AC | PRN

## 2020-06-23 NOTE — Progress Notes (Signed)
Educated Pt in the importance to ambulate several times a day in addition to hydrate and eat fresh vegetables and fruits.  If Pt gets constipated she should use stool OTC  Softeners.

## 2020-06-23 NOTE — Addendum Note (Signed)
Addendum  created 06/23/20 1048 by Cleda Clarks, CRNA   Attestation recorded in [place], Intraprocedure Attestations filed

## 2020-06-23 NOTE — Progress Notes (Signed)
Progress Note  1 Day Post-Op  Subjective: Patient reports some soreness this AM. Denies nausea but is getting bloated with eating. No flatus. Has not been up walking much yet, encouraged ambulation. Discussed post-operative care.   Objective: Vital signs in last 24 hours: Temp:  [98.4 F (36.9 C)-99.2 F (37.3 C)] 98.5 F (36.9 C) (04/07 0426) Pulse Rate:  [70-96] 70 (04/07 0426) Resp:  [15-20] 20 (04/06 2048) BP: (112-125)/(65-86) 123/72 (04/07 0426) SpO2:  [99 %-100 %] 100 % (04/07 0426) Last BM Date: 06/21/20  Intake/Output from previous day: 04/06 0701 - 04/07 0700 In: 1580 [P.O.:480; I.V.:1100] Out: 360 [Urine:350; Blood:10] Intake/Output this shift: No intake/output data recorded.  PE: General: pleasant, WD, WN female who is laying in bed in NAD Heart: regular, rate, and rhythm.   Lungs: CTAB, no wheezes, rhonchi, or rales noted.  Respiratory effort nonlabored Abd: soft, appropriately ttp, ND, BS hypoactive, incisions C/D/I MS: all 4 extremities are symmetrical with no cyanosis, clubbing, or edema. Skin: warm and dry with no masses, lesions, or rashes Neuro: Cranial nerves 2-12 grossly intact, sensation is normal throughout Psych: A&Ox3 with an appropriate affect.    Lab Results:  Recent Labs    06/21/20 1926 06/22/20 0332  WBC 11.4* 13.2*  HGB 12.5 11.9*  HCT 38.5 35.7*  PLT 373 332   BMET Recent Labs    06/21/20 1926 06/22/20 0332  NA 136 135  K 4.2 3.9  CL 103 105  CO2 26 23  GLUCOSE 95 132*  BUN 8 5*  CREATININE 0.86 0.69  CALCIUM 9.0 8.2*   PT/INR No results for input(s): LABPROT, INR in the last 72 hours. CMP     Component Value Date/Time   NA 135 06/22/2020 0332   K 3.9 06/22/2020 0332   CL 105 06/22/2020 0332   CO2 23 06/22/2020 0332   GLUCOSE 132 (H) 06/22/2020 0332   BUN 5 (L) 06/22/2020 0332   CREATININE 0.69 06/22/2020 0332   CALCIUM 8.2 (L) 06/22/2020 0332   PROT 6.1 (L) 06/22/2020 0332   ALBUMIN 2.9 (L) 06/22/2020 0332    AST 19 06/22/2020 0332   ALT 17 06/22/2020 0332   ALKPHOS 63 06/22/2020 0332   BILITOT 0.4 06/22/2020 0332   GFRNONAA >60 06/22/2020 0332   Lipase     Component Value Date/Time   LIPASE 25 06/21/2020 1926       Studies/Results: CT ABDOMEN PELVIS W CONTRAST  Result Date: 06/21/2020 CLINICAL DATA:  Right lower quadrant abdominal pain. EXAM: CT ABDOMEN AND PELVIS WITH CONTRAST TECHNIQUE: Multidetector CT imaging of the abdomen and pelvis was performed using the standard protocol following bolus administration of intravenous contrast. CONTRAST:  OMNIPAQUE IOHEXOL 300 MG/ML  SOLN COMPARISON:  None. FINDINGS: Lower chest: No acute abnormality. Hepatobiliary: No focal liver abnormality. Nonspecific hydropic gallbladder measuring up to 4 cm on axial imaging. No gallstones, gallbladder wall thickening, or pericholecystic fluid. No biliary dilatation. Pancreas: No focal lesion. Normal pancreatic contour. No surrounding inflammatory changes. No main pancreatic ductal dilatation. Spleen: Normal in size without focal abnormality. Adrenals/Urinary Tract: No adrenal nodule bilaterally. Bilateral kidneys enhance symmetrically. No hydronephrosis. No hydroureter. The urinary bladder is decompressed and grossly unremarkable. Stomach/Bowel: Stomach is within normal limits. No evidence of bowel wall thickening or dilatation. Appendix appears normal. Vascular/Lymphatic: No significant vascular findings are present. No enlarged abdominal or pelvic lymph nodes. Reproductive: Uterus and bilateral adnexa are unremarkable. Other: No intraperitoneal free fluid. No intraperitoneal free gas. No organized fluid collection. Musculoskeletal:  No acute or significant osseous findings. IMPRESSION: 1. Nonspecific hydropic gallbladder with otherwise no acute intra-abdominal or intrapelvic abnormality. If clinically indicated, consider right upper quadrant ultrasound for a more sensitive evaluation of the gallbladder. 2. Normal  appendix. Electronically Signed   By: Tish Frederickson M.D.   On: 06/21/2020 22:29   US Abdomen Limited RUQ (LIVER/GB)  Result Date: 06/21/2020 CLINICAL DATA:  Right upper quadrant abdominal pain EXAM: ULTRASOUND ABDOMEN LIMITED RIGHT UPPER QUADRANT COMPARISON:  CT from same day FINDINGS: Gallbladder: There is cholelithiasis with mild borderline gallbladder wall thickening. The gallbladder is distended. The sonographic Eulah Pont sign is positive. There is no pericholecystic free fluid. Common bile duct: Diameter: 3 mm Liver: No focal lesion identified. Within normal limits in parenchymal echogenicity. Portal vein is patent on color Doppler imaging with normal direction of blood flow towards the liver. Other: None. IMPRESSION: Cholelithiasis with findings suspicious for acute cholecystitis. Surgical consultation is recommended. If imaging findings are discordant with laboratory and physical exam findings, a HIDA scan would be useful for further evaluation. Electronically Signed   By: Katherine Mantle M.D.   On: 06/21/2020 23:50    Anti-infectives: Anti-infectives (From admission, onward)   Start     Dose/Rate Route Frequency Ordered Stop   06/22/20 2200  cefTRIAXone (ROCEPHIN) 2 g in sodium chloride 0.9 % 100 mL IVPB  Status:  Discontinued        2 g 200 mL/hr over 30 Minutes Intravenous Every 24 hours 06/22/20 0008 06/22/20 1809   06/22/20 0015  cefTRIAXone (ROCEPHIN) 2 g in sodium chloride 0.9 % 100 mL IVPB        2 g 200 mL/hr over 30 Minutes Intravenous  Once 06/22/20 0000 06/22/20 0102       Assessment/Plan GERD Asthma PCOS  Acute cholecystitis  S/P laparoscopic cholecystectomy 06/22/20 Dr. Cliffton Asters - POD#1 - incisions c/d/i - tolerating liquids, some discomfort with eating  - encourage mobilization this AM - recheck for probably discharge this PM  FEN: reg diet  VTE: lovenox ID: [email or web address] 4/6   LOS: 0 days    Juliet Rude , [facility] Surgery 06/23/2020, 10:19  AM Please see Amion for pager number during day hours 7:00am-4:30pm

## 2020-06-23 NOTE — Plan of Care (Signed)
  Problem: Education: Goal: Knowledge of General Education information will improve Description: Including pain rating scale, medication(s)/side effects and non-pharmacologic comfort measures Outcome: Adequate for Discharge   Problem: Health Behavior/Discharge Planning: Goal: Ability to manage health-related needs will improve Outcome: Adequate for Discharge   Problem: Clinical Measurements: Goal: Will remain free from infection Outcome: Adequate for Discharge   Problem: Activity: Goal: Risk for activity intolerance will decrease Outcome: Adequate for Discharge   Problem: Nutrition: Goal: Adequate nutrition will be maintained Outcome: Adequate for Discharge

## 2020-06-28 NOTE — Discharge Summary (Signed)
[facility] Discharge Summary   Patient ID: Rebecca Lester MRN: [id number] DOB/AGE: [birth year] 29 y.o.  Admit date: 06/21/2020 Discharge date: 06/28/2020   Discharge Diagnosis Patient Active Problem List   Diagnosis Date Noted  . Acute cholecystitis 06/22/2020    Consultants None   Imaging: 06/21/20 RUQ U/S IMPRESSION: Cholelithiasis with findings suspicious for acute cholecystitis. Surgical consultation is recommended. If imaging findings are discordant with laboratory and physical exam findings, a HIDA scan would be useful for further evaluation.   Procedures Dr. Marin Olp (06/22/20) - Laparoscopic Cholecystectomy   HPI:  Patient is a 30 year old female who presented to [facility] with RUQ abdominal pain that started about 5 days ago. Pain has waxed and waned since onset but never fully resolved. Pain described as sharp and non-radiating. Associated nausea with bilious emesis. Patient denies experiencing pain like this ever previously. Denies fever, chills, chest pain, SOB, urinary symptoms. PMH otherwise significant for GERD, Asthma, PCOS. No past abdominal surgery. NKDA. She does not take any blood thinning medications. She reports rare alcohol use and denies tobacco or illicit drug use. She is not currently working  Hospital Course:  Workup showed acute cholecystitis.  Patient was admitted and underwent procedure listed above.  Tolerated procedure well and was transferred to the floor.  Diet was advanced as tolerated.  On POD#1, the patient was voiding well, tolerating diet, ambulating well, pain well controlled, vital signs stable, incisions c/d/i and felt stable for discharge home.  Patient will follow up in our office in 2-3 weeks and knows to call with questions or concerns.  She will call to confirm appointment date/time.     Allergies as of 06/23/2020   No Known Allergies     Medication List    TAKE these medications   acetaminophen 500 MG  tablet Commonly known as: TYLENOL Take 2 tablets (1,000 mg total) by mouth every 8 (eight) hours as needed for mild pain or fever.   buPROPion 300 MG 24 hr tablet Commonly known as: WELLBUTRIN XL Take 300 mg by mouth every morning.   ibuprofen 200 MG tablet Commonly known as: ADVIL Take 2 tablets (400 mg total) by mouth every 6 (six) hours as needed for fever or headache.   Mili 0.25-35 MG-MCG tablet Generic drug: norgestimate-ethinyl estradiol Take 1 tablet by mouth daily.   omeprazole 20 MG capsule Commonly known as: PRILOSEC Take 20 mg by mouth daily.   oxyCODONE 5 MG immediate release tablet Commonly known as: Oxy IR/ROXICODONE Take 1 tablet (5 mg total) by mouth every 6 (six) hours as needed for moderate pain.         Follow-up Information    [facility].   Why: Please follow up with primary care. [facility] will [facility] patients without insurance. Contact information: [address] [place] [address] [phone]       [facility], [place]. Go on 07/12/2020.   Specialty: General Surgery Why: 11:30 AM. Please arrive 30 min prior to appointment time. Bring photo ID and insurance information.  Contact information: [address] [place] [address] [phone]               Signed: Hosie Spangle, [facility] 06/28/2020, 3:22 PM

## 2022-03-21 ENCOUNTER — Telehealth: Payer: Self-pay

## 2022-03-21 NOTE — Telephone Encounter (Signed)
Sending mychart msg. AS, CMA 

## 2022-08-14 IMAGING — US US ABDOMEN LIMITED RUQ/ASCITES
1 series · 14 of 25 positions shown · non-contrast
Comparison: CT from same day

CLINICAL DATA: Right upper quadrant abdominal pain

EXAM:
ULTRASOUND ABDOMEN LIMITED RIGHT UPPER QUADRANT

[Series 1: us abdomen limited ruq (liver/gb) · 14 of 49 slices shown]
[im 1/49]
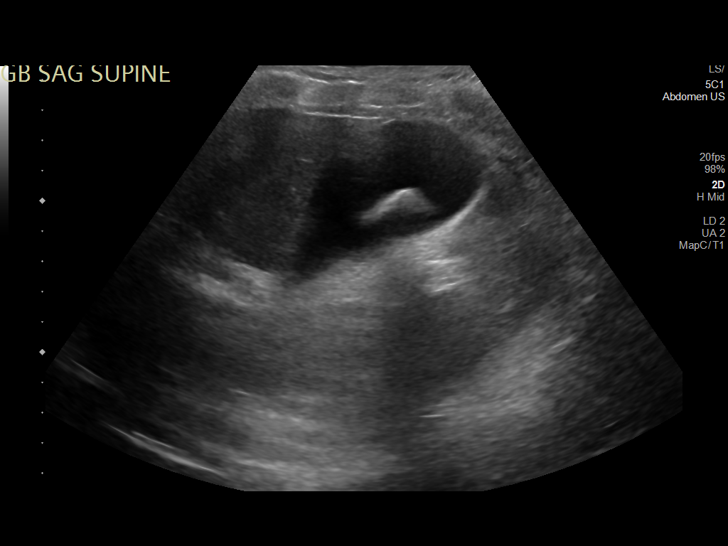
[im 5/49]
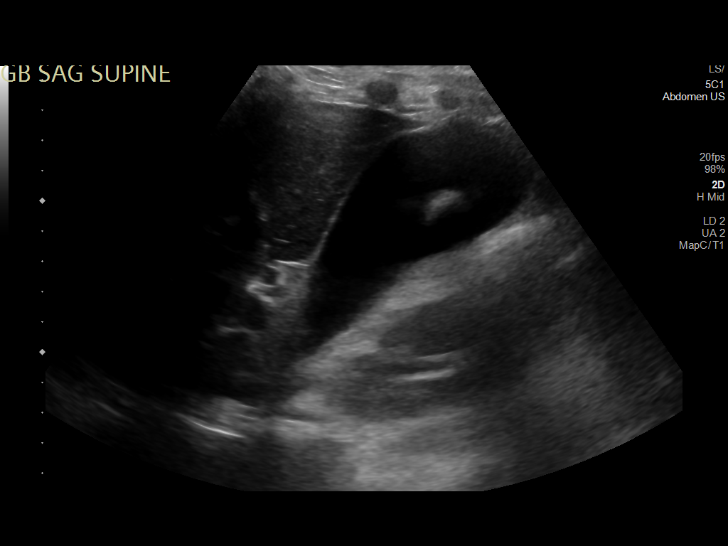
[im 9/49]
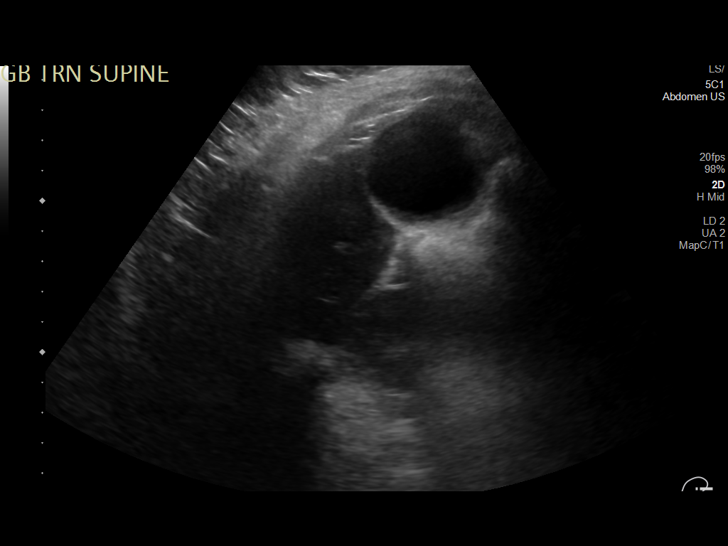
[im 13/49]
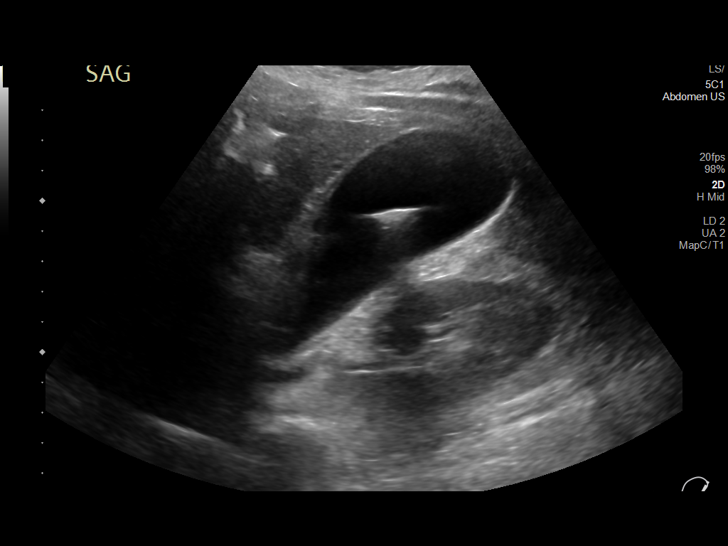
[im 17/49]
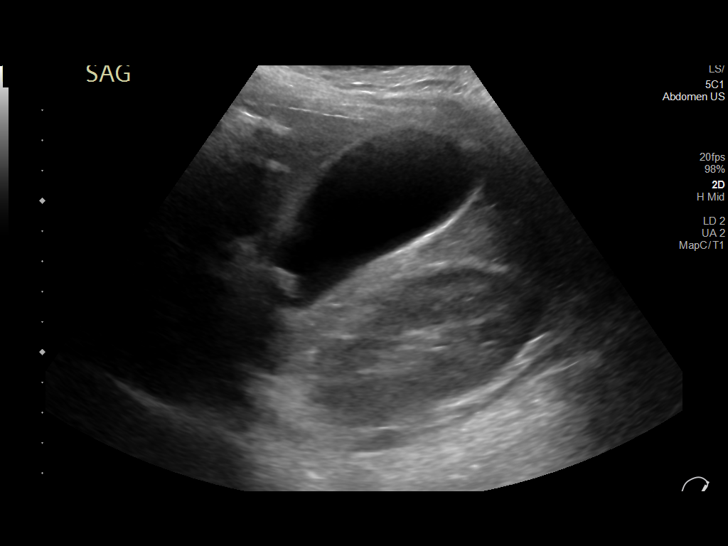
[im 19/49]
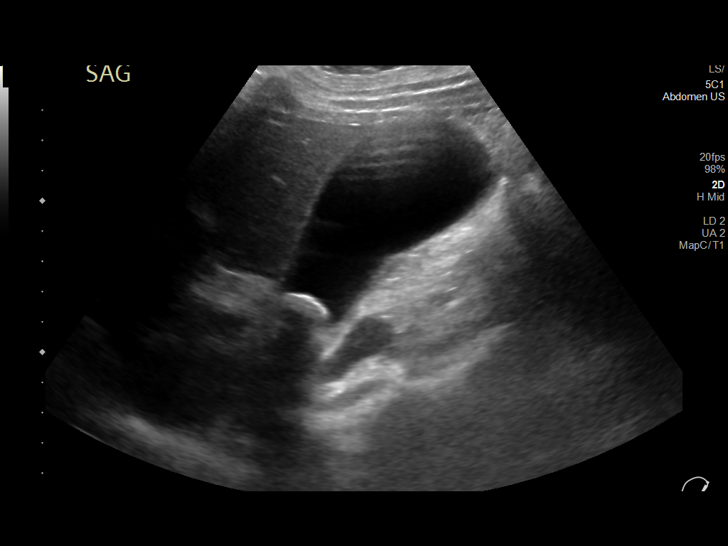
[im 23/49]
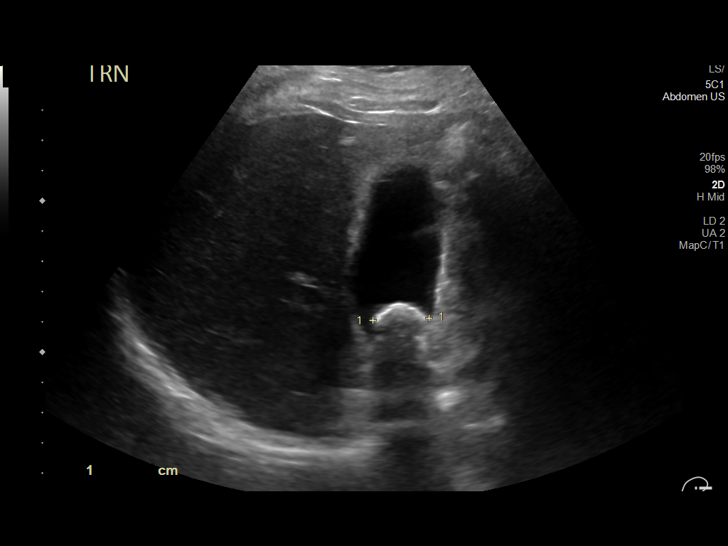
[im 27/49]
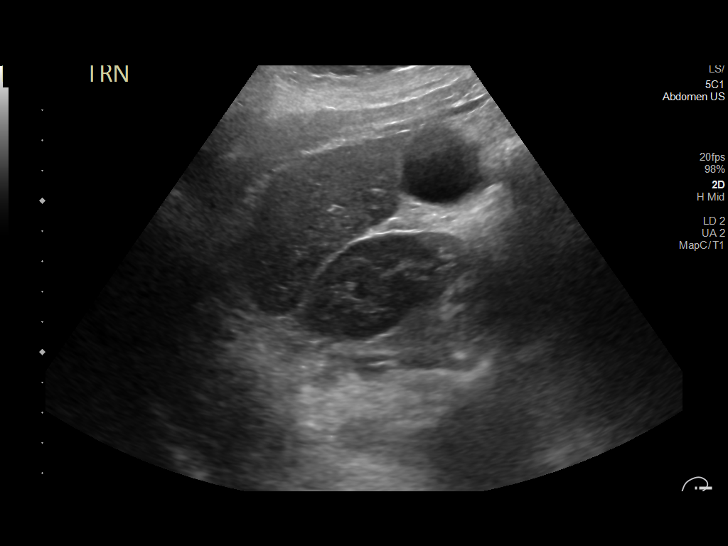
[im 31/49]
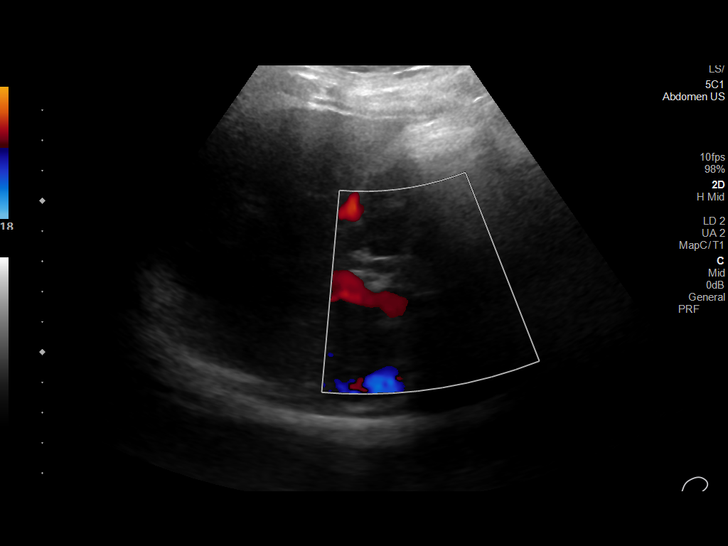
[im 33/49]
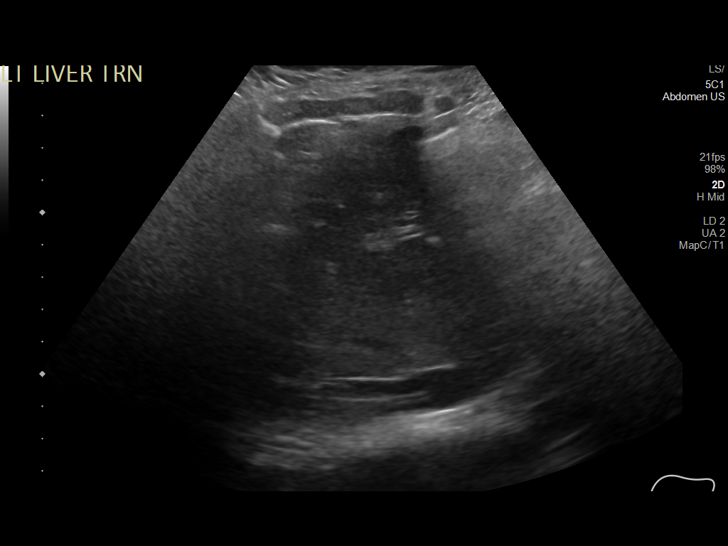
[im 37/49]
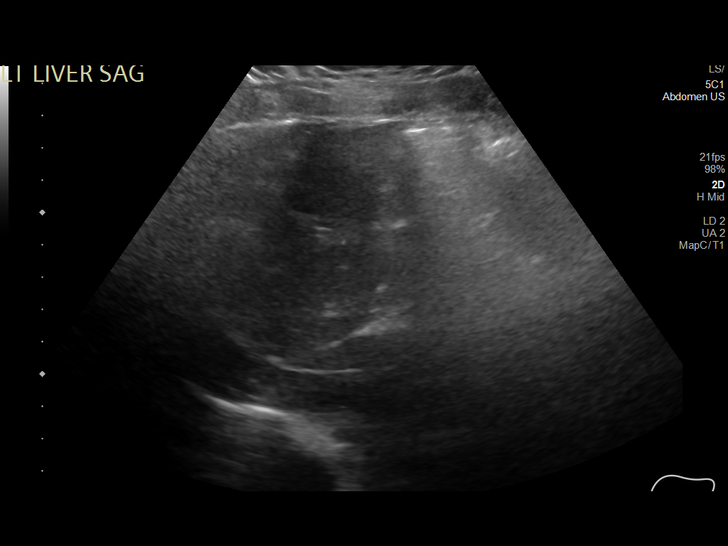
[im 41/49]
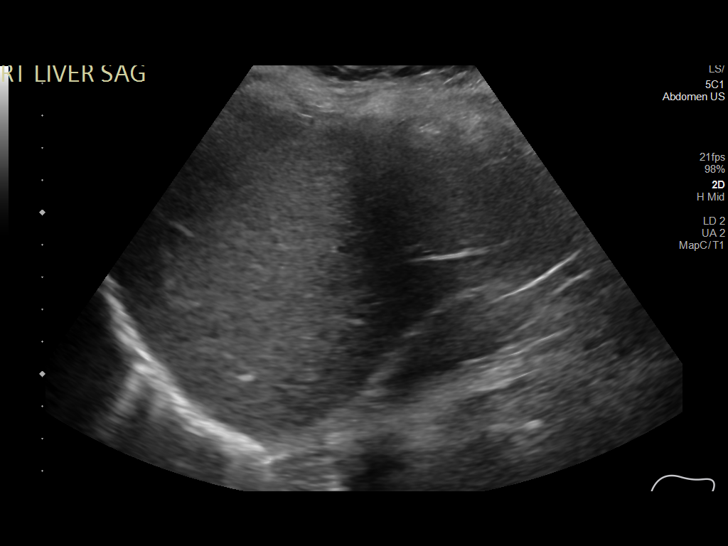
[im 45/49]
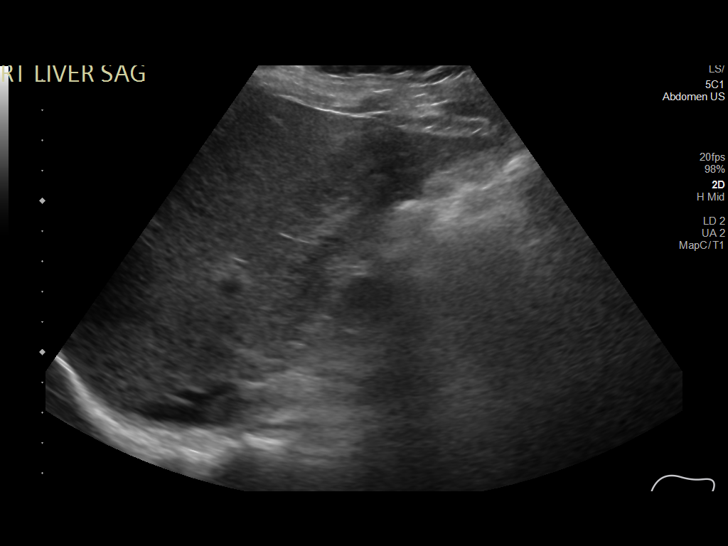
[im 49/49]
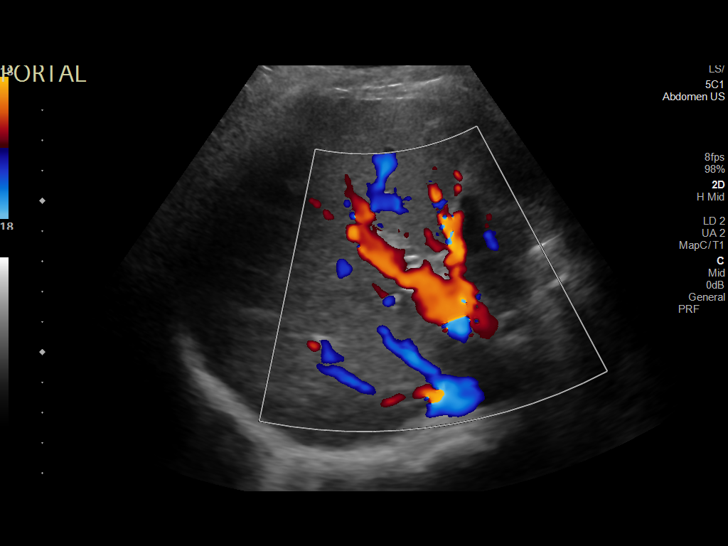

[14 of 25 positions shown; findings below may reference images not displayed]

FINDINGS: Gallbladder:

There is cholelithiasis with mild borderline gallbladder wall
thickening. The gallbladder is distended. The sonographic Murphy
sign is positive. There is no pericholecystic free fluid.

Common bile duct:

Diameter: 3 mm

Liver:

No focal lesion identified. Within normal limits in parenchymal
echogenicity. Portal vein is patent on color Doppler imaging with
normal direction of blood flow towards the liver.

Other: None.
IMPRESSION: Cholelithiasis with findings suspicious for acute cholecystitis.
Surgical consultation is recommended. If imaging findings are
discordant with laboratory and physical exam findings, a HIDA scan
would be useful for further evaluation.

## 2022-08-14 IMAGING — CT CT ABD-PELV W/ CM
2 of 3 series · 17 of 46 positions shown, 19 images · IV contrast (Omni 300)
Comparison: None.

CLINICAL DATA: Right lower quadrant abdominal pain.

EXAM:
CT ABDOMEN AND PELVIS WITH CONTRAST
TECHNIQUE: Multidetector CT imaging of the abdomen and pelvis was performed
using the standard protocol following bolus administration of
intravenous contrast.
CONTRAST:  100mL OMNIPAQUE IOHEXOL 300 MG/ML  SOLN

[Series 3: a/p w/ 5mm · axial · 0.65mm/px · z∈[-508,-133]mm · 14 of 87 slices shown, 16 images]
[im 6/87  soft-tissue]
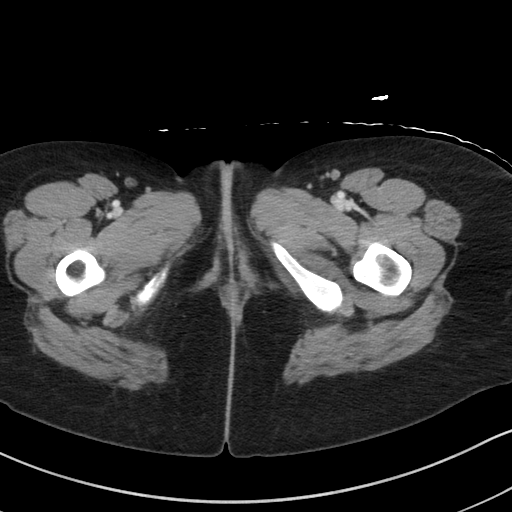
[im 6/87  bone]
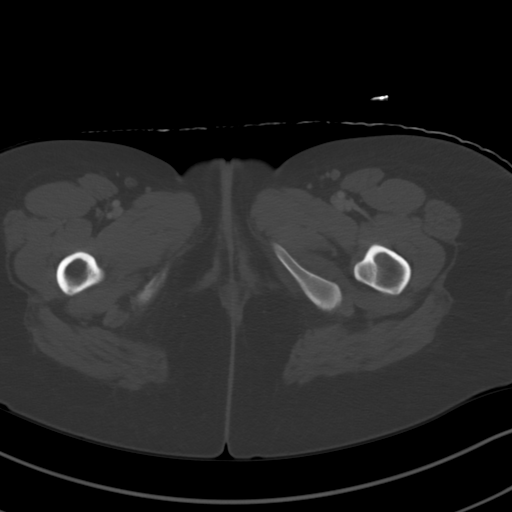
[im 12/87  soft-tissue]
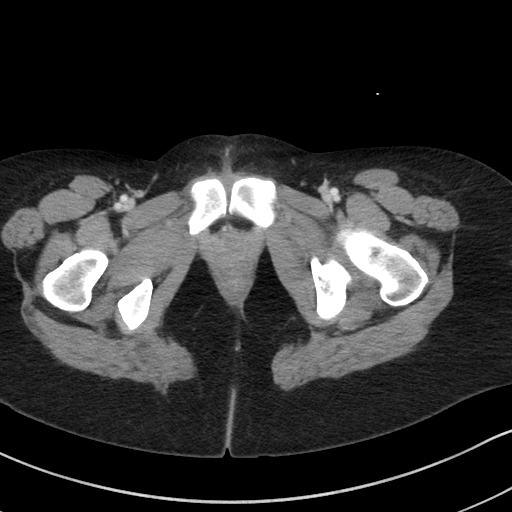
[im 17/87  soft-tissue]
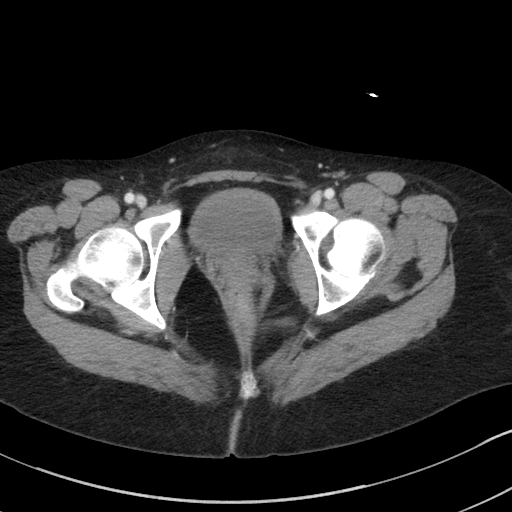
[im 23/87  soft-tissue]
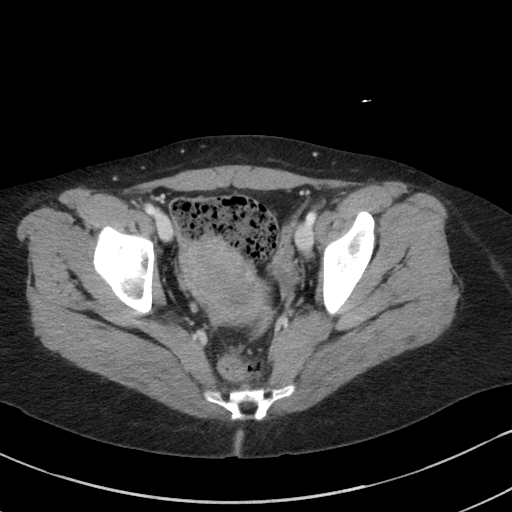
[im 28/87  soft-tissue]
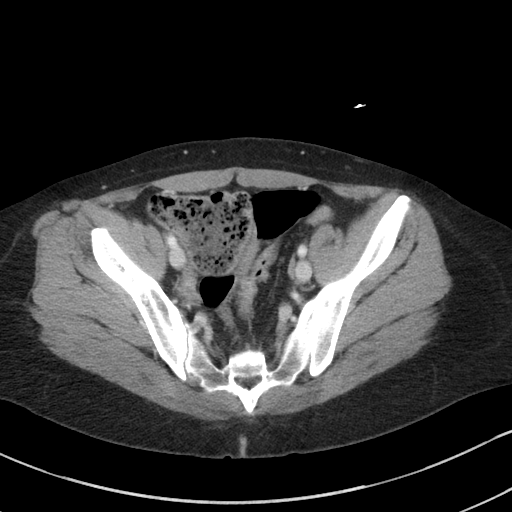
[im 34/87  soft-tissue]
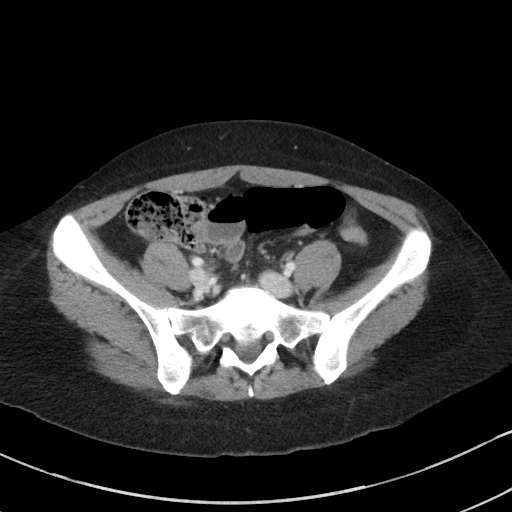
[im 39/87  soft-tissue]
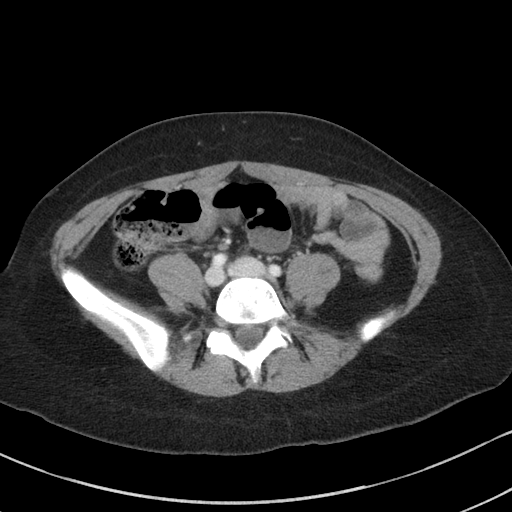
[im 48/87  soft-tissue]
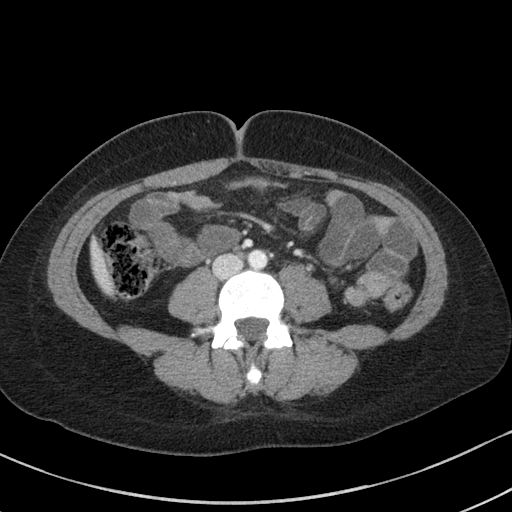
[im 53/87  soft-tissue]
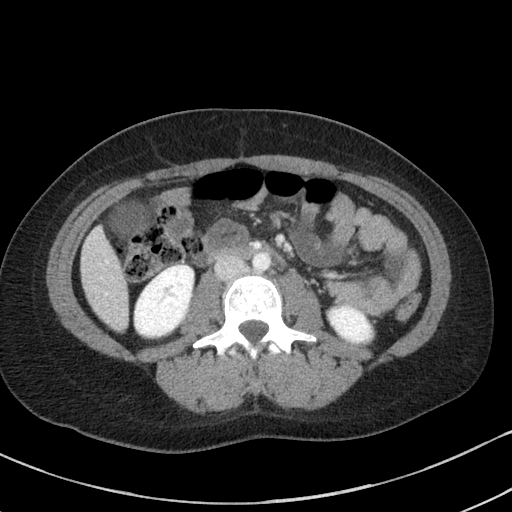
[im 53/87  bone]
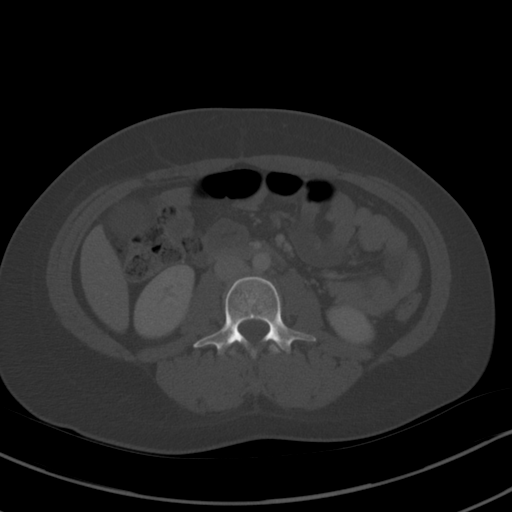
[im 59/87  soft-tissue]
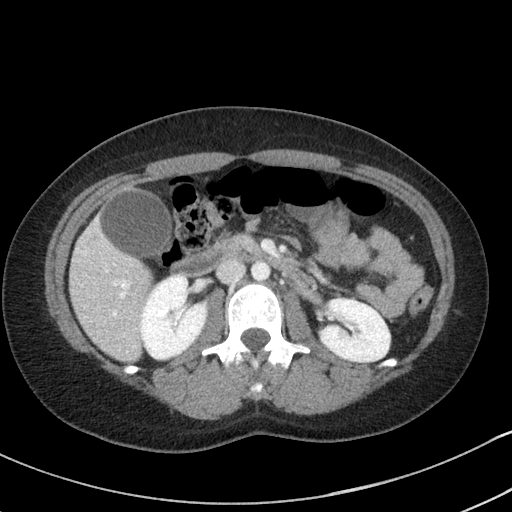
[im 64/87  soft-tissue]
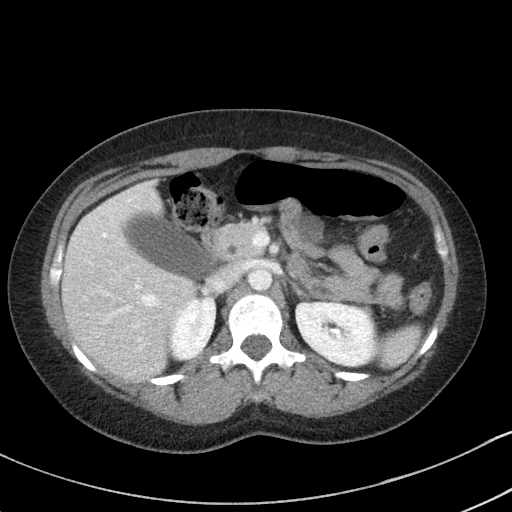
[im 70/87  soft-tissue]
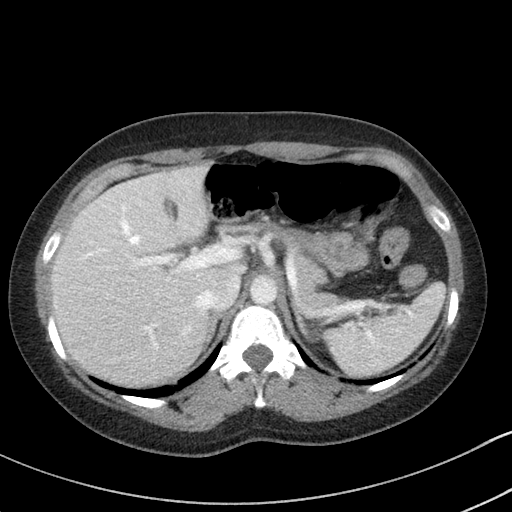
[im 75/87  soft-tissue]
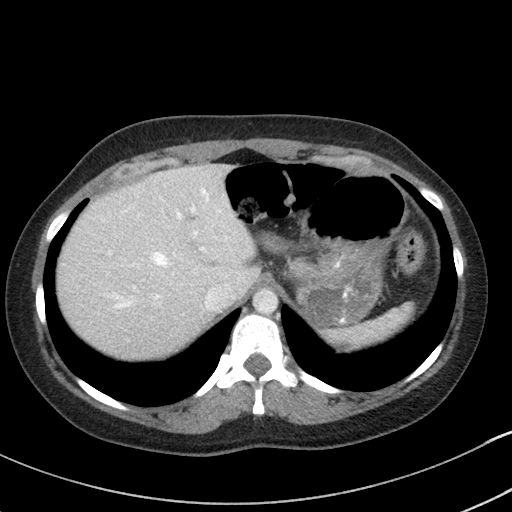
[im 81/87  soft-tissue]
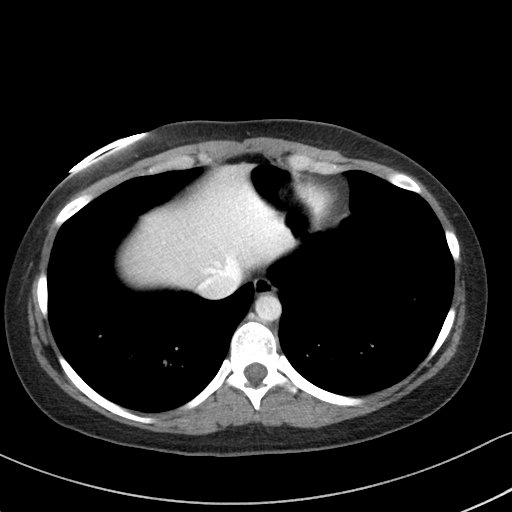

[Series 6: a/p w/ cor · coronal · 0.82mm/px · 3 of 116 slices shown]
[im 39/116  soft-tissue]
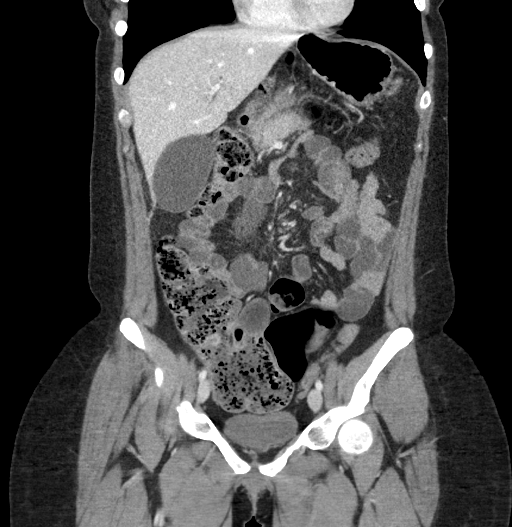
[im 52/116  soft-tissue]
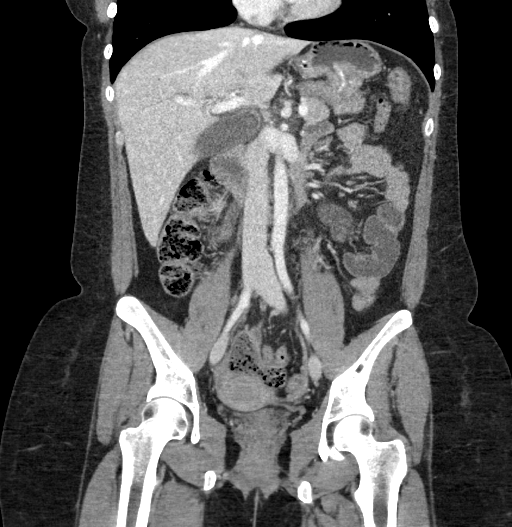
[im 64/116  soft-tissue]
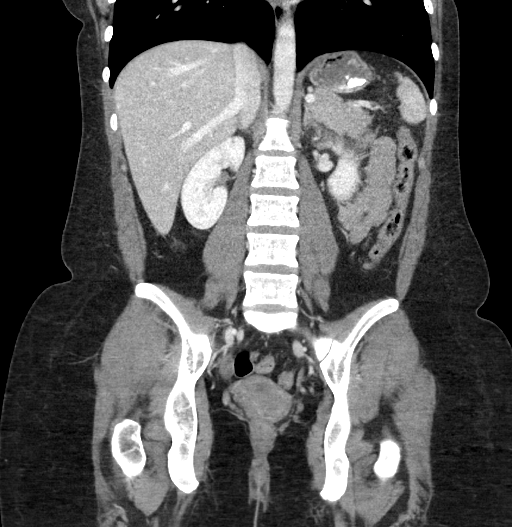

[17 of 46 positions shown; findings below may reference images not displayed]

FINDINGS: Lower chest: No acute abnormality.

Hepatobiliary: No focal liver abnormality. Nonspecific hydropic
gallbladder measuring up to 4 cm on axial imaging. No gallstones,
gallbladder wall thickening, or pericholecystic fluid. No biliary
dilatation.

Pancreas: No focal lesion. Normal pancreatic contour. No surrounding
inflammatory changes. No main pancreatic ductal dilatation.

Spleen: Normal in size without focal abnormality.

Adrenals/Urinary Tract:

No adrenal nodule bilaterally.

Bilateral kidneys enhance symmetrically. No hydronephrosis. No
hydroureter.

The urinary bladder is decompressed and grossly unremarkable.

Stomach/Bowel: Stomach is within normal limits. No evidence of bowel
wall thickening or dilatation. Appendix appears normal.

Vascular/Lymphatic: No significant vascular findings are present. No
enlarged abdominal or pelvic lymph nodes.

Reproductive: Uterus and bilateral adnexa are unremarkable.

Other: No intraperitoneal free fluid. No intraperitoneal free gas.
No organized fluid collection.

Musculoskeletal: No acute or significant osseous findings.
IMPRESSION: 1. Nonspecific hydropic gallbladder with otherwise no acute
intra-abdominal or intrapelvic abnormality. If clinically indicated,
consider right upper quadrant ultrasound for a more sensitive
evaluation of the gallbladder.
2. Normal appendix.
